# Patient Record
Sex: Female | Born: 1955 | Race: White | Hispanic: No | Marital: Married | Smoking: Never smoker
Health system: Southern US, Community
[De-identification: ages and names within clinical notes are randomized; demographics above are authoritative.]

## PROBLEM LIST (undated history)

## (undated) DIAGNOSIS — J329 Chronic sinusitis, unspecified: Secondary | ICD-10-CM

## (undated) DIAGNOSIS — I10 Essential (primary) hypertension: Secondary | ICD-10-CM

## (undated) DIAGNOSIS — Z5189 Encounter for other specified aftercare: Secondary | ICD-10-CM

## (undated) DIAGNOSIS — M1990 Unspecified osteoarthritis, unspecified site: Secondary | ICD-10-CM

## (undated) DIAGNOSIS — J302 Other seasonal allergic rhinitis: Secondary | ICD-10-CM

## (undated) DIAGNOSIS — K219 Gastro-esophageal reflux disease without esophagitis: Secondary | ICD-10-CM

## (undated) HISTORY — PX: KNEE ARTHROSCOPY: SUR90

## (undated) HISTORY — PX: CHOLECYSTECTOMY: SHX55

## (undated) HISTORY — PX: COLONOSCOPY: SHX174

## (undated) HISTORY — DX: Gastro-esophageal reflux disease without esophagitis: K21.9

## (undated) HISTORY — DX: Encounter for other specified aftercare: Z51.89

## (undated) HISTORY — PX: AUGMENTATION MAMMAPLASTY: SUR837

---
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
## 1997-08-20 ENCOUNTER — Encounter: Admission: RE | Admit: 1997-08-20 | Discharge: 1997-08-20 | Payer: Self-pay | Admitting: Family Medicine

## 1997-10-30 ENCOUNTER — Encounter: Admission: RE | Admit: 1997-10-30 | Discharge: 1997-10-30 | Payer: Self-pay | Admitting: Family Medicine

## 1998-04-09 ENCOUNTER — Encounter: Admission: RE | Admit: 1998-04-09 | Discharge: 1998-04-09 | Payer: Self-pay | Admitting: Sports Medicine

## 1999-06-14 ENCOUNTER — Encounter: Admission: RE | Admit: 1999-06-14 | Discharge: 1999-06-14 | Payer: Self-pay | Admitting: Family Medicine

## 1999-06-21 ENCOUNTER — Encounter: Admission: RE | Admit: 1999-06-21 | Discharge: 1999-06-21 | Payer: Self-pay | Admitting: Family Medicine

## 1999-06-22 ENCOUNTER — Encounter: Admission: RE | Admit: 1999-06-22 | Discharge: 1999-07-26 | Payer: Self-pay | Admitting: Sports Medicine

## 1999-07-09 ENCOUNTER — Encounter: Admission: RE | Admit: 1999-07-09 | Discharge: 1999-07-09 | Payer: Self-pay | Admitting: Family Medicine

## 1999-07-26 ENCOUNTER — Encounter: Admission: RE | Admit: 1999-07-26 | Discharge: 1999-07-26 | Payer: Self-pay | Admitting: Sports Medicine

## 2000-08-15 ENCOUNTER — Encounter: Admission: RE | Admit: 2000-08-15 | Discharge: 2000-08-15 | Payer: Self-pay | Admitting: Family Medicine

## 2000-08-31 ENCOUNTER — Encounter: Admission: RE | Admit: 2000-08-31 | Discharge: 2000-08-31 | Payer: Self-pay | Admitting: Family Medicine

## 2000-09-05 ENCOUNTER — Encounter: Payer: Self-pay | Admitting: Sports Medicine

## 2000-09-05 ENCOUNTER — Encounter: Admission: RE | Admit: 2000-09-05 | Discharge: 2000-09-05 | Payer: Self-pay | Admitting: Sports Medicine

## 2000-10-04 ENCOUNTER — Encounter (INDEPENDENT_AMBULATORY_CARE_PROVIDER_SITE_OTHER): Payer: Self-pay | Admitting: Specialist

## 2000-10-04 ENCOUNTER — Observation Stay (HOSPITAL_COMMUNITY): Admission: RE | Admit: 2000-10-04 | Discharge: 2000-10-05 | Payer: Self-pay | Admitting: General Surgery

## 2000-10-04 ENCOUNTER — Encounter: Payer: Self-pay | Admitting: General Surgery

## 2001-11-15 ENCOUNTER — Encounter: Admission: RE | Admit: 2001-11-15 | Discharge: 2001-11-15 | Payer: Self-pay | Admitting: Family Medicine

## 2001-12-20 ENCOUNTER — Encounter: Admission: RE | Admit: 2001-12-20 | Discharge: 2001-12-20 | Payer: Self-pay | Admitting: Sports Medicine

## 2002-01-16 ENCOUNTER — Encounter: Admission: RE | Admit: 2002-01-16 | Discharge: 2002-01-16 | Payer: Self-pay | Admitting: Family Medicine

## 2002-01-24 ENCOUNTER — Encounter: Admission: RE | Admit: 2002-01-24 | Discharge: 2002-01-24 | Payer: Self-pay | Admitting: Sports Medicine

## 2002-02-28 ENCOUNTER — Encounter: Admission: RE | Admit: 2002-02-28 | Discharge: 2002-02-28 | Payer: Self-pay | Admitting: Sports Medicine

## 2003-01-30 ENCOUNTER — Encounter: Admission: RE | Admit: 2003-01-30 | Discharge: 2003-01-30 | Payer: Self-pay | Admitting: Family Medicine

## 2004-05-27 ENCOUNTER — Ambulatory Visit: Payer: Self-pay | Admitting: Sports Medicine

## 2004-05-27 ENCOUNTER — Ambulatory Visit (HOSPITAL_COMMUNITY): Admission: RE | Admit: 2004-05-27 | Discharge: 2004-05-27 | Payer: Self-pay | Admitting: Sports Medicine

## 2004-06-25 ENCOUNTER — Ambulatory Visit (HOSPITAL_COMMUNITY): Admission: RE | Admit: 2004-06-25 | Discharge: 2004-06-25 | Payer: Self-pay | Admitting: Sports Medicine

## 2004-06-25 ENCOUNTER — Ambulatory Visit: Payer: Self-pay | Admitting: Sports Medicine

## 2004-08-12 ENCOUNTER — Ambulatory Visit: Payer: Self-pay | Admitting: Sports Medicine

## 2004-09-16 ENCOUNTER — Ambulatory Visit: Payer: Self-pay | Admitting: Family Medicine

## 2004-10-08 ENCOUNTER — Ambulatory Visit: Payer: Self-pay | Admitting: Psychology

## 2004-10-14 ENCOUNTER — Ambulatory Visit: Payer: Self-pay | Admitting: Sports Medicine

## 2004-10-14 ENCOUNTER — Encounter: Payer: Self-pay | Admitting: Sports Medicine

## 2004-10-28 ENCOUNTER — Ambulatory Visit: Payer: Self-pay | Admitting: Psychology

## 2004-11-09 ENCOUNTER — Ambulatory Visit: Payer: Self-pay | Admitting: Family Medicine

## 2005-04-12 ENCOUNTER — Ambulatory Visit: Payer: Self-pay | Admitting: Family Medicine

## 2005-04-28 ENCOUNTER — Ambulatory Visit: Payer: Self-pay | Admitting: Family Medicine

## 2005-05-12 ENCOUNTER — Ambulatory Visit: Payer: Self-pay | Admitting: Family Medicine

## 2005-05-26 ENCOUNTER — Ambulatory Visit: Payer: Self-pay | Admitting: Psychology

## 2005-06-07 ENCOUNTER — Ambulatory Visit: Payer: Self-pay | Admitting: Sports Medicine

## 2005-06-21 ENCOUNTER — Ambulatory Visit: Payer: Self-pay | Admitting: Family Medicine

## 2005-07-14 ENCOUNTER — Ambulatory Visit: Payer: Self-pay | Admitting: Psychology

## 2005-08-01 ENCOUNTER — Ambulatory Visit: Payer: Self-pay | Admitting: Psychology

## 2005-08-15 ENCOUNTER — Ambulatory Visit: Payer: Self-pay | Admitting: Psychology

## 2005-09-08 ENCOUNTER — Ambulatory Visit: Payer: Self-pay | Admitting: Psychology

## 2005-09-29 ENCOUNTER — Ambulatory Visit: Payer: Self-pay | Admitting: Family Medicine

## 2005-10-13 ENCOUNTER — Ambulatory Visit: Payer: Self-pay | Admitting: Psychology

## 2005-10-27 ENCOUNTER — Ambulatory Visit: Payer: Self-pay | Admitting: Sports Medicine

## 2005-11-10 ENCOUNTER — Ambulatory Visit: Payer: Self-pay | Admitting: Psychology

## 2005-12-15 ENCOUNTER — Ambulatory Visit: Payer: Self-pay | Admitting: Psychology

## 2005-12-22 ENCOUNTER — Ambulatory Visit: Payer: Self-pay | Admitting: Sports Medicine

## 2006-01-19 ENCOUNTER — Ambulatory Visit: Payer: Self-pay | Admitting: Family Medicine

## 2006-02-23 ENCOUNTER — Ambulatory Visit: Payer: Self-pay | Admitting: Sports Medicine

## 2006-04-20 DIAGNOSIS — J45909 Unspecified asthma, uncomplicated: Secondary | ICD-10-CM

## 2006-04-20 DIAGNOSIS — J309 Allergic rhinitis, unspecified: Secondary | ICD-10-CM

## 2006-09-25 ENCOUNTER — Telehealth: Payer: Self-pay | Admitting: *Deleted

## 2006-09-26 ENCOUNTER — Ambulatory Visit: Payer: Self-pay | Admitting: Family Medicine

## 2007-05-10 ENCOUNTER — Ambulatory Visit: Payer: Self-pay | Admitting: Sports Medicine

## 2007-05-24 ENCOUNTER — Telehealth: Payer: Self-pay | Admitting: *Deleted

## 2007-05-24 ENCOUNTER — Telehealth: Payer: Self-pay | Admitting: Sports Medicine

## 2007-05-24 ENCOUNTER — Encounter: Payer: Self-pay | Admitting: *Deleted

## 2007-07-23 ENCOUNTER — Telehealth: Payer: Self-pay | Admitting: Psychology

## 2007-07-24 ENCOUNTER — Encounter: Payer: Self-pay | Admitting: Sports Medicine

## 2007-07-26 ENCOUNTER — Ambulatory Visit: Payer: Self-pay | Admitting: Sports Medicine

## 2007-07-26 ENCOUNTER — Ambulatory Visit: Payer: Self-pay | Admitting: Psychology

## 2007-07-26 DIAGNOSIS — F4323 Adjustment disorder with mixed anxiety and depressed mood: Secondary | ICD-10-CM

## 2007-07-27 ENCOUNTER — Ambulatory Visit: Payer: Self-pay | Admitting: Family Medicine

## 2007-07-27 ENCOUNTER — Encounter: Payer: Self-pay | Admitting: Sports Medicine

## 2007-07-30 LAB — CONVERTED CEMR LAB
Albumin: 3.6 g/dL (ref 3.5–5.2)
BUN: 20 mg/dL (ref 6–23)
CO2: 27 meq/L (ref 19–32)
Calcium: 9 mg/dL (ref 8.4–10.5)
Chloride: 107 meq/L (ref 96–112)
Cholesterol: 194 mg/dL (ref 0–200)
Glucose, Bld: 91 mg/dL (ref 70–99)
HDL: 58 mg/dL (ref >39)
Potassium: 4.6 meq/L (ref 3.5–5.3)
Triglycerides: 60 mg/dL (ref <150)

## 2007-08-09 ENCOUNTER — Ambulatory Visit: Payer: Self-pay | Admitting: Psychology

## 2007-08-09 ENCOUNTER — Ambulatory Visit: Payer: Self-pay | Admitting: Sports Medicine

## 2007-08-09 ENCOUNTER — Encounter: Payer: Self-pay | Admitting: Sports Medicine

## 2007-08-22 ENCOUNTER — Telehealth: Payer: Self-pay | Admitting: *Deleted

## 2007-08-22 ENCOUNTER — Ambulatory Visit: Payer: Self-pay | Admitting: Family Medicine

## 2007-08-22 ENCOUNTER — Encounter: Payer: Self-pay | Admitting: Family Medicine

## 2007-08-22 DIAGNOSIS — F449 Dissociative and conversion disorder, unspecified: Secondary | ICD-10-CM

## 2007-08-29 ENCOUNTER — Telehealth: Payer: Self-pay | Admitting: Family Medicine

## 2007-08-30 ENCOUNTER — Ambulatory Visit: Payer: Self-pay | Admitting: Psychology

## 2007-10-09 ENCOUNTER — Ambulatory Visit: Payer: Self-pay | Admitting: Psychology

## 2007-10-18 ENCOUNTER — Ambulatory Visit: Payer: Self-pay | Admitting: Sports Medicine

## 2007-10-24 ENCOUNTER — Encounter: Payer: Self-pay | Admitting: Sports Medicine

## 2007-10-25 ENCOUNTER — Ambulatory Visit: Payer: Self-pay | Admitting: Sports Medicine

## 2007-10-25 ENCOUNTER — Ambulatory Visit: Payer: Self-pay | Admitting: Family Medicine

## 2007-11-22 ENCOUNTER — Ambulatory Visit: Payer: Self-pay | Admitting: Family Medicine

## 2008-01-28 ENCOUNTER — Telehealth: Payer: Self-pay | Admitting: *Deleted

## 2008-01-30 ENCOUNTER — Ambulatory Visit: Payer: Self-pay | Admitting: Family Medicine

## 2008-01-30 ENCOUNTER — Emergency Department (HOSPITAL_COMMUNITY): Admission: EM | Admit: 2008-01-30 | Discharge: 2008-01-30 | Payer: Self-pay | Admitting: Emergency Medicine

## 2008-01-30 ENCOUNTER — Encounter: Payer: Self-pay | Admitting: Family Medicine

## 2008-02-07 ENCOUNTER — Ambulatory Visit: Payer: Self-pay | Admitting: Family Medicine

## 2008-04-24 ENCOUNTER — Ambulatory Visit: Payer: Self-pay | Admitting: Family Medicine

## 2008-05-08 ENCOUNTER — Encounter: Payer: Self-pay | Admitting: Family Medicine

## 2008-05-08 ENCOUNTER — Encounter: Admission: RE | Admit: 2008-05-08 | Discharge: 2008-05-08 | Payer: Self-pay | Admitting: Family Medicine

## 2008-07-17 ENCOUNTER — Ambulatory Visit: Payer: Self-pay | Admitting: Family Medicine

## 2008-08-14 ENCOUNTER — Encounter: Admission: RE | Admit: 2008-08-14 | Discharge: 2008-08-14 | Payer: Self-pay | Admitting: Family Medicine

## 2008-08-14 ENCOUNTER — Telehealth: Payer: Self-pay | Admitting: Family Medicine

## 2008-08-15 ENCOUNTER — Ambulatory Visit: Payer: Self-pay | Admitting: Sports Medicine

## 2008-08-29 ENCOUNTER — Ambulatory Visit: Payer: Self-pay | Admitting: Sports Medicine

## 2008-10-29 ENCOUNTER — Encounter (INDEPENDENT_AMBULATORY_CARE_PROVIDER_SITE_OTHER): Payer: Self-pay | Admitting: *Deleted

## 2008-10-29 ENCOUNTER — Ambulatory Visit: Payer: Self-pay | Admitting: Sports Medicine

## 2008-10-31 ENCOUNTER — Encounter: Payer: Self-pay | Admitting: Family Medicine

## 2008-11-06 ENCOUNTER — Encounter: Admission: RE | Admit: 2008-11-06 | Discharge: 2008-12-10 | Payer: Self-pay | Admitting: Sports Medicine

## 2008-11-06 ENCOUNTER — Encounter: Payer: Self-pay | Admitting: Sports Medicine

## 2008-11-19 ENCOUNTER — Encounter: Payer: Self-pay | Admitting: Family Medicine

## 2008-11-25 ENCOUNTER — Encounter: Payer: Self-pay | Admitting: Family Medicine

## 2008-11-25 ENCOUNTER — Ambulatory Visit: Payer: Self-pay | Admitting: Family Medicine

## 2008-12-09 ENCOUNTER — Ambulatory Visit: Payer: Self-pay | Admitting: Sports Medicine

## 2008-12-10 ENCOUNTER — Encounter: Admission: RE | Admit: 2008-12-10 | Discharge: 2008-12-10 | Payer: Self-pay | Admitting: Sports Medicine

## 2008-12-10 ENCOUNTER — Encounter: Payer: Self-pay | Admitting: Sports Medicine

## 2008-12-15 ENCOUNTER — Encounter (INDEPENDENT_AMBULATORY_CARE_PROVIDER_SITE_OTHER): Payer: Self-pay | Admitting: *Deleted

## 2008-12-22 ENCOUNTER — Encounter: Payer: Self-pay | Admitting: Family Medicine

## 2009-06-17 ENCOUNTER — Ambulatory Visit (HOSPITAL_COMMUNITY): Admission: RE | Admit: 2009-06-17 | Discharge: 2009-06-17 | Payer: Self-pay | Admitting: Family Medicine

## 2009-06-17 ENCOUNTER — Encounter (INDEPENDENT_AMBULATORY_CARE_PROVIDER_SITE_OTHER): Payer: Self-pay | Admitting: *Deleted

## 2009-06-17 ENCOUNTER — Encounter: Payer: Self-pay | Admitting: Cardiology

## 2009-06-17 ENCOUNTER — Ambulatory Visit: Payer: Self-pay | Admitting: Family Medicine

## 2009-06-17 DIAGNOSIS — R079 Chest pain, unspecified: Secondary | ICD-10-CM

## 2009-06-17 DIAGNOSIS — H538 Other visual disturbances: Secondary | ICD-10-CM | POA: Insufficient documentation

## 2009-06-17 LAB — CONVERTED CEMR LAB
AST: 19 units/L (ref 0–37)
Alkaline Phosphatase: 36 units/L — ABNORMAL LOW (ref 39–117)
BUN: 19 mg/dL (ref 6–23)
Calcium: 8.5 mg/dL (ref 8.4–10.5)
Creatinine, Ser: 0.69 mg/dL (ref 0.40–1.20)
Glucose, Bld: 93 mg/dL (ref 70–99)
HCT: 45.4 % (ref 36.0–46.0)
HDL: 55 mg/dL (ref >39)
Hemoglobin: 14.8 g/dL (ref 12.0–15.0)
LDL Cholesterol: 125 mg/dL — ABNORMAL HIGH (ref 0–99)
MCHC: 32.6 g/dL (ref 30.0–36.0)
RDW: 13.7 % (ref 11.5–15.5)
TSH: 2.041 microintl units/mL (ref 0.350–4.500)
Total CHOL/HDL Ratio: 3.5
Triglycerides: 62 mg/dL (ref <150)

## 2009-06-19 ENCOUNTER — Encounter: Payer: Self-pay | Admitting: Family Medicine

## 2009-06-23 ENCOUNTER — Encounter: Payer: Self-pay | Admitting: Family Medicine

## 2009-06-25 ENCOUNTER — Ambulatory Visit: Payer: Self-pay | Admitting: Surgery

## 2009-06-25 ENCOUNTER — Encounter: Payer: Self-pay | Admitting: Family Medicine

## 2009-06-25 ENCOUNTER — Ambulatory Visit (HOSPITAL_COMMUNITY): Admission: RE | Admit: 2009-06-25 | Discharge: 2009-06-25 | Payer: Self-pay | Admitting: Family Medicine

## 2009-06-29 ENCOUNTER — Encounter: Payer: Self-pay | Admitting: Family Medicine

## 2009-06-30 ENCOUNTER — Ambulatory Visit: Payer: Self-pay | Admitting: Cardiology

## 2009-07-14 ENCOUNTER — Ambulatory Visit (HOSPITAL_COMMUNITY): Admission: RE | Admit: 2009-07-14 | Discharge: 2009-07-14 | Payer: Self-pay | Admitting: Cardiology

## 2009-07-14 ENCOUNTER — Encounter: Payer: Self-pay | Admitting: Cardiology

## 2009-07-14 ENCOUNTER — Ambulatory Visit: Payer: Self-pay

## 2009-07-14 ENCOUNTER — Ambulatory Visit: Payer: Self-pay | Admitting: Cardiology

## 2009-07-29 ENCOUNTER — Ambulatory Visit: Payer: Self-pay | Admitting: Family Medicine

## 2009-07-29 ENCOUNTER — Encounter: Admission: RE | Admit: 2009-07-29 | Discharge: 2009-07-29 | Payer: Self-pay | Admitting: Family Medicine

## 2009-07-29 DIAGNOSIS — R609 Edema, unspecified: Secondary | ICD-10-CM | POA: Insufficient documentation

## 2009-08-12 ENCOUNTER — Encounter (INDEPENDENT_AMBULATORY_CARE_PROVIDER_SITE_OTHER): Payer: Self-pay | Admitting: *Deleted

## 2009-09-23 ENCOUNTER — Ambulatory Visit: Payer: Self-pay | Admitting: Family Medicine

## 2009-09-23 DIAGNOSIS — R0989 Other specified symptoms and signs involving the circulatory and respiratory systems: Secondary | ICD-10-CM

## 2009-09-23 DIAGNOSIS — R0609 Other forms of dyspnea: Secondary | ICD-10-CM | POA: Insufficient documentation

## 2009-09-23 DIAGNOSIS — M25569 Pain in unspecified knee: Secondary | ICD-10-CM

## 2009-09-23 LAB — CONVERTED CEMR LAB
BUN: 21 mg/dL (ref 6–23)
CO2: 26 meq/L (ref 19–32)
Calcium: 8.8 mg/dL (ref 8.4–10.5)
Creatinine, Ser: 0.69 mg/dL (ref 0.40–1.20)

## 2009-09-24 ENCOUNTER — Encounter: Payer: Self-pay | Admitting: Family Medicine

## 2009-09-27 ENCOUNTER — Encounter: Payer: Self-pay | Admitting: Family Medicine

## 2009-10-15 ENCOUNTER — Ambulatory Visit (HOSPITAL_BASED_OUTPATIENT_CLINIC_OR_DEPARTMENT_OTHER): Admission: RE | Admit: 2009-10-15 | Discharge: 2009-10-15 | Payer: Self-pay | Admitting: Family Medicine

## 2009-10-15 ENCOUNTER — Encounter: Payer: Self-pay | Admitting: Family Medicine

## 2009-10-17 ENCOUNTER — Ambulatory Visit: Payer: Self-pay | Admitting: Internal Medicine

## 2009-11-30 ENCOUNTER — Encounter: Payer: Self-pay | Admitting: Family Medicine

## 2009-12-01 ENCOUNTER — Telehealth: Payer: Self-pay | Admitting: *Deleted

## 2009-12-10 ENCOUNTER — Encounter: Payer: Self-pay | Admitting: Family Medicine

## 2009-12-10 DIAGNOSIS — G4700 Insomnia, unspecified: Secondary | ICD-10-CM | POA: Insufficient documentation

## 2010-03-23 NOTE — Assessment & Plan Note (Signed)
Summary: f/up,tcb   Vital Signs:  Patient profile:   55 year old female Height:      64 inches Weight:      177 pounds BMI:     30.49 O2 Sat:      98 % Temp:     98.2 degrees F oral Pulse rate:   96 / minute BP sitting:   136 / 98  (left arm) Cuff size:   regular CC: f/u asthma Is Patient Diabetic? No Comments pt states she feels like "lump" in throat   Primary Care Provider:  Denny Levy MD  CC:  f/u asthma.  History of Present Illness: f/u chest pains---not having them nearly as often  2) f/u "something stuck in throat"--no better. She did realize after starting the hartburn med that she HAD been having daily heartburn--it stopped and she no longer has to use tums or maalox. But she still has a sense of a "lump" in her throat. Does not interfere with swallowing. has gotten no worse.  3) Asthma--doing OK with current  meds.  4) snoring--continues to be a problem--wonders if this is what is causing the odd sensation in her throat. Nightly loud snring--occasionally wakes herself up. Some daytime fatigue.  5) Insomnia---some nights difficulty getting to sleep--about twice a week. Sometimes it is pain from her knee (previous arthroscope--[facility]). Sometimes it is just worry and aggravarion that keeps her awake. Some daytime fatigue.  5) LE edema improved with the HCTZ. No problems with it  Habits & Providers  Alcohol-Tobacco-Diet     Tobacco Status: never  Current Medications (verified): 1)  Singulair 10 Mg  Tabs (Montelukast Sodium) .... Take One Tablet Daily 2)  Multivitamin 3)  Prempro 0.3-1.5 Mg Tabs (Conj Estrog-Medroxyprogest Ace) .... Take 1 By Mouth Daily 4)  Vicodin 5-500 Mg Tabs (Hydrocodone-Acetaminophen) .... One By Mouth Q6 As Needed Pain 5)  Zyrtec Allergy 10 Mg Tabs (Cetirizine Hcl) .... Once Daily 6)  Glucosamine-Chondroitin 500-400 Mg Caps (Glucosamine-Chondroitin) .... 2000 Twice Daily 7)  Calcium 600 Mg Tabs (Calcium) .... Take One Tablety Once  Daily 8)  Levsin/sl 0.125 Mg Subl (Hyoscyamine Sulfate) .... Generic If Available Sig 1 Tab Under Tongue As Needed Esophagus Spasm, May Repeat Every Ten Minutes For Max of Three Tabs A Day 9)  Omeprazole 40 Mg Cpdr (Omeprazole) .Marland Kitchen.. 1 By Mouth Qd 10)  Advair Diskus 250-50 Mcg/dose Aepb (Fluticasone-Salmeterol) .Marland Kitchen.. 1 Puff Bid 11)  Hydrochlorothiazide 25 Mg Tabs (Hydrochlorothiazide) .... 1/2 Tab By Mouth Qd 12)  Ambien 10 Mg Tabs (Zolpidem Tartrate) .Marland Kitchen.. 1 By Mouth At Bedtime As Needed For Sleep  Allergies (verified): No Known Drug Allergies  Review of Systems       Please see HPI for additional ROS.   Physical Exam  General:  alert and well-developed.   Neck:  supple, full ROM, no masses, no thyromegaly, no thyroid nodules or tenderness, no JVD, and no carotid bruits.   Lungs:  normal respiratory effort, normal breath sounds, and no wheezes.   Heart:  normal rate, regular rhythm, and no murmur.   Abdomen:  soft.   Neurologic:  alert & oriented X3.   Psych:  memory intact for recent and remote, normally interactive, good eye contact, not anxious appearing, and not depressed appearing.     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 1/2 tab by mouth qd  Orders: Basic Met-[facility] ([address]-56213) better on HCTZ check potassium  Problem # 4:  KNEE PAIN, RIGHT, CHRONIC (ICD-719.46)  Her updated medication list for this problem includes:    Vicodin 5-500 Mg Tabs (Hydrocodone-acetaminophen) ..... One by mouth q6 as needed pain chronic knee pain since her 'scope. She will make appt w  her orthopedist--rx for 1-2 vicodin as needed at night  Problem # 5:  ASTHMA, UNSPECIFIED (ICD-493.90)  Her updated medication list for this problem includes:    Singulair 10 Mg Tabs (Montelukast sodium) .Marland Kitchen... Take one tablet daily    Advair Diskus 250-50 Mcg/dose Aepb (Fluticasone-salmeterol) .Marland Kitchen... 1 puff bid  Orders: [facility]- Est  Level 4 ([address]) doing OK__ I am still not sure this is classic asthma  Complete Medication List: 1)  Singulair 10 Mg Tabs (Montelukast sodium) .... Take one tablet daily 2)  Multivitamin  3)  Prempro 0.3-1.5 Mg Tabs (Conj estrog-medroxyprogest ace) .... Take 1 by mouth daily 4)  Vicodin 5-500 Mg Tabs (Hydrocodone-acetaminophen) .... One by mouth q6 as needed pain 5)  Zyrtec Allergy 10 Mg Tabs (Cetirizine hcl) .... Once daily 6)  Glucosamine-chondroitin 500-400 Mg Caps (Glucosamine-chondroitin) .... 2000 twice daily 7)  Calcium 600 Mg Tabs (Calcium) .... Take one tablety once daily 8)  Levsin/sl 0.125 Mg Subl (Hyoscyamine sulfate) .... Generic if available sig 1 tab under tongue as needed esophagus spasm, may repeat every ten minutes for max of three tabs a day 9)  Omeprazole 40 Mg Cpdr (Omeprazole) .Marland Kitchen.. 1 by mouth qd 10)  Advair Diskus 250-50 Mcg/dose Aepb (Fluticasone-salmeterol) .Marland Kitchen.. 1 puff bid 11)  Hydrochlorothiazide 25 Mg Tabs (Hydrochlorothiazide) .... 1/2 tab by mouth qd 12)  Ambien 10 Mg Tabs (Zolpidem tartrate) .Marland Kitchen.. 1 by mouth at bedtime as needed for sleep  Patient Instructions: 1)  Please schedule a follow-up appointment in 2 months.  Prescriptions: AMBIEN 10 MG TABS (ZOLPIDEM TARTRATE) 1 by mouth at bedtime as needed for sleep  #30 x 1   Entered and Authorized by:   Denny Levy MD   Signed by:   Denny Levy MD on 09/23/2009   Method used:   Print then Give to Patient   RxID:   [id number] VICODIN 5-500 MG TABS (HYDROCODONE-ACETAMINOPHEN) one by mouth q6 as needed pain  #60 x 0   Entered and Authorized by:   Denny Levy MD   Signed by:   Denny Levy MD on 09/23/2009   Method used:   Print then Give to Patient   RxID:   [id number]

## 2010-03-23 NOTE — Progress Notes (Signed)
  Phone Note Outgoing Call   Call placed by: Jimmy Footman, CMA,  December 01, 2009 12:33 PM Summary of Call: LVM for patient to call back office to inform of rx being called into pharmacy. [facility] [address]  Follow-up for Phone Call        pt called and informed of rx Follow-up by: Loralee Pacas CMA,  December 02, 2009 10:02 AM

## 2010-03-23 NOTE — Letter (Signed)
Summary: Generic Letter  [facility]  [address]   [place], [place] [address]   Phone: [phone]  Fax: [phone]    09/24/2009  DAROLYN DOUBLE [address] [place], [place]  [address]  Dear Ms. Shellee Milo,  Sodium, glucose, potassium and kidney function all normal.         Sincerely,   Denny Levy MD  Appended Document: Generic Letter mailed
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
## 2010-03-23 NOTE — Assessment & Plan Note (Signed)
Summary: F/U/KH   Vital Signs:  Patient profile:   55 year old female Height:      64 inches Weight:      178 pounds BMI:     30.66 O2 Sat:      96 % on Room air Temp:     98.1 degrees F oral Pulse rate:   80 / minute BP sitting:   127 / 92  (right arm) Cuff size:   regular  Vitals Entered By: Tessie Fass CMA (July 29, 2009 9:49 AM)  O2 Flow:  Room air CC: F/U Pain Assessment Patient in pain? yes     Location: chest Intensity: 4    Primary Care Provider:  Denny Levy MD  CC:  F/U.  History of Present Illness: f/u chst pains--had cardiac myoview ane ECHo (negative). continues to have chect pains, esp at work  Also now had an episode of what was dx as esophagela spasm--intense pain in chedt while at [employer]--assoc with sweating, panic sensation and some SOB. lasted about 20 minutes. Slef resolved when she got in air conditioning--went to ED and was diagnosed with esophageal spasm. says there is a sensation she has something stuck in her throat all of the time.  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 Take One Tablet Daily 2)  Multivitamin 3)  Prempro 0.3-1.5 Mg Tabs (Conj Estrog-Medroxyprogest Ace) .... Take 1 By Mouth Daily 4)  Vicodin 5-500 Mg Tabs (Hydrocodone-Acetaminophen) .... One By Mouth Q6 As Needed Pain 5)  Zyrtec Allergy 10 Mg Tabs (Cetirizine Hcl) .... Once Daily 6)  Glucosamine-Chondroitin 500-400 Mg Caps (Glucosamine-Chondroitin) .... 2000 Twice Daily 7)  Calcium 600 Mg Tabs (Calcium) .... Take One Tablety Once Daily 8)  Levsin/sl 0.125 Mg Subl (Hyoscyamine Sulfate) .... Generic If Available Sig 1 Tab Under Tongue As Needed Esophagus Spasm, May Repeat Every Ten Minutes For Max of  Three Tabs A Day 9)  Omeprazole 40 Mg Cpdr (Omeprazole) .Marland Kitchen.. 1 By Mouth Qd 10)  Advair Diskus 250-50 Mcg/dose Aepb (Fluticasone-Salmeterol) .Marland Kitchen.. 1 Puff Bid 11)  Hydrochlorothiazide 25 Mg Tabs (Hydrochlorothiazide) .... 1/2 Tab By Mouth Qd  Allergies: No Known Drug Allergies  Review of Systems       The patient complains of chest pain, dyspnea on exertion, and peripheral edema.  The patient denies anorexia, fever, weight loss, vision loss, syncope, prolonged cough, headaches, abdominal pain, and muscle weakness.    Physical Exam  General:  alert, well-developed, and well-nourished.   Neck:  supple, full ROM, no masses, no thyromegaly, no JVD, and no carotid bruits.   Lungs:  normal respiratory effort, normal breath sounds, and no wheezes.   Heart:  normal rate, regular rhythm, and no murmur.   Abdomen:  soft, non-tender, and normal bowel sounds.   Extremities:  1+ edema B LE Neurologic:  alert & oriented X3 and gait normal.   Psych:  normally interactive and slightly anxious.     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 Take one tablet daily 2)  Multivitamin  3)  Prempro 0.3-1.5 Mg Tabs (Conj estrog-medroxyprogest ace) .... Take 1 by mouth daily 4)  Vicodin 5-500 Mg Tabs (Hydrocodone-acetaminophen)  .... One by mouth q6 as needed pain 5)  Zyrtec Allergy 10 Mg Tabs (Cetirizine hcl) .... Once daily 6)  Glucosamine-chondroitin 500-400 Mg Caps (Glucosamine-chondroitin) .... 2000 twice daily 7)  Calcium 600 Mg Tabs (Calcium) .... Take one tablety once daily 8)  Levsin/sl 0.125 Mg Subl (Hyoscyamine sulfate) .... Generic if available sig 1 tab under tongue as needed esophagus spasm, may repeat every ten minutes for max of three tabs a day 9)  Omeprazole 40 Mg Cpdr (Omeprazole) .Marland Kitchen.. 1 by mouth qd 10)  Advair Diskus 250-50 Mcg/dose Aepb (Fluticasone-salmeterol) .Marland Kitchen.. 1 puff bid 11)  Hydrochlorothiazide 25 Mg Tabs (Hydrochlorothiazide) .... 1/2 tab by mouth qd Pulse Oximetry- [facility] (94760) [facility]- Est  Level 4 ([address])  Patient Instructions: 1)  We are starting omeprazole for your esophageal spasm and what I think is silent reflux. Please take one a day. 2)  If you have another episode of spasm, try the levsin. 3)  For your edema and slight elevation of blood pressure, we are going to try a low dose diuretic called hydrochlorothiazide. Please take one a day 4)  Please see me in about  4 weeks. 5)  Please set her up for pharmacy clinic for pulmonary function tests Prescriptions: HYDROCHLOROTHIAZIDE 25 MG TABS (HYDROCHLOROTHIAZIDE) 1/2 tab by mouth qd  #15 x 1   Entered and Authorized by:   Denny Levy MD   Signed by:   Denny Levy MD on 07/29/2009   Method used:   Electronically to        [facility] [address] [id number]* (retail)       [address] [place], [place]  [address]       Ph: [phone]       Fax: [phone]   RxID:   [id number] LEVSIN/SL 0.125 MG SUBL (HYOSCYAMINE SULFATE) generic if available sig 1 tab under tongue as needed esophagus spasm, may repeat every ten minutes for max of three tabs a day  #30 x 3   Entered and Authorized by:   Denny Levy MD   Signed by:   Denny Levy MD on 07/29/2009   Method used:   Electronically to        [facility] [id number]* (retail)        [address] [place], [place]  [address]       Ph: [phone]       Fax: [phone]   RxID:   [id number] OMEPRAZOLE 40 MG CPDR (OMEPRAZOLE) 1 by mouth qd  #30 x 12   Entered and Authorized by:   Denny Levy MD   Signed by:   Denny Levy MD on 07/29/2009   Method used:   Electronically to        [facility] [address] [id number]* (retail)       [phone]       [place], [place]  [address]       Ph: [phone]       Fax: [phone]   RxID:   [id number] ADVAIR DISKUS 250-50 MCG/DOSE AEPB (FLUTICASONE-SALMETEROL) 1 puff bid  #3 x 3   Entered and Authorized by:   Denny Levy MD   Signed by:   Denny Levy MD on 07/29/2009   Method used:   Print then Give to Patient   RxID:   [id number]    Impression & Recommendations:  Orders: [facility]- Est  Level 4 (25427)   Orders: [facility]- Est  Level 4 (06237)   Her updated medication list for this problem includes:    Singulair 10 Mg Tabs (Montelukast sodium) .Marland Kitchen... Take one tablet daily    Advair Diskus 250-50 Mcg/dose Aepb (Fluticasone-salmeterol) .Marland Kitchen... 1 puff bid  Orders: Pulse Oximetry- [facility] (94760) [facility]- Est  Level 4 ([address]31)   Her updated medication list for this problem includes:    Hydrochlorothiazide 25 Mg Tabs (Hydrochlorothiazide) .Marland Kitchen... 1/2 tab by mouth qd   Complete Medication List: 1)  Singulair 10 Mg Tabs (Montelukast sodium) .... Take one tablet daily 2)  Multivitamin  3)  Prempro 0.3-1.5 Mg Tabs (Conj estrog-medroxyprogest ace) .... Take 1 by mouth daily 4)  Vicodin 5-500 Mg Tabs (Hydrocodone-acetaminophen) .... One by mouth q6 as needed pain 5)  Zyrtec Allergy 10 Mg Tabs (Cetirizine hcl) .... Once daily 6)  Glucosamine-chondroitin 500-400 Mg Caps (Glucosamine-chondroitin) .... 2000 twice daily 7)  Calcium 600 Mg Tabs (Calcium) .... Take one tablety once daily 8)  Levsin/sl 0.125 Mg Subl (Hyoscyamine sulfate) .... Generic if available sig 1 tab under tongue as needed esophagus spasm, may repeat every ten  minutes for max of three tabs a day 9)  Omeprazole 40 Mg Cpdr (Omeprazole) .Marland Kitchen.. 1 by mouth qd 10)  Advair Diskus 250-50 Mcg/dose Aepb (Fluticasone-salmeterol) .Marland Kitchen.. 1 puff bid 11)  Hydrochlorothiazide 25 Mg Tabs (Hydrochlorothiazide) .... 1/2 tab by mouth qd

## 2010-03-23 NOTE — Letter (Signed)
Summary: [facility]  [address]   [place], [place] [address]   Phone: [phone]  Fax: [phone]    06/19/2009  YARET HUSH [address] [place], [place]  [address]  Dear Ms. MYER, your cholesterol and lab work is satisfactory. I have listed the entire cholesteol panel below.  Cholesterol               192 mg/dL                   4-401     ATP III Classification:           < 200        mg/dL        Desirable          200 - 239     mg/dL        Borderline High          >= 240        mg/dL        High         Triglyceride              62 mg/dL                    <027   HDL Cholesterol           55 mg/dL                    >25   Total Chol/HDL Ratio      3.5 Ratio  VLDL Cholesterol (Calc)                             12 mg/dL                    3-66  LDL Cholesterol (Calc)                        [H]  125 mg/dL                   4-40             Sincerely,   Denny Levy MD  Appended Document: LABLetter mailed.

## 2010-03-23 NOTE — Miscellaneous (Signed)
  Clinical Lists Changes  Problems: Changed problem from ASTHMA, UNSPECIFIED (ICD-493.90) to ASTHMA, INTERMITTENT, MILD (ICD-493.90)

## 2010-03-23 NOTE — Letter (Signed)
Summary: LAB Letter  [facility]  [address]   [place], [place] [address]   Phone: [phone]  Fax: [phone]    06/29/2009  Paula Aguilar [address] [place], [place]  [address]  Dear Ms. Shellee Milo,   Your carotid doppler studies were normal.        Sincerely,   Denny Levy MD  Appended Document: LAB Letter mailed.

## 2010-03-23 NOTE — Letter (Signed)
Summary: Out of Work  [facility]  [address]   [place], [place] [address]   Phone: [phone]  Fax: [phone]    June 17, 2009   Employee:  Paula Aguilar    To Whom It May Concern:   For Medical reasons, please excuse the above named employee from work for the following dates:  Start:   June 17, 2009    End:  June 17, 2009  If you need additional information, please feel free to contact our office.         Sincerely,    Gladstone Pih

## 2010-03-23 NOTE — Miscellaneous (Signed)
Summary: prior auth  Clinical Lists Changes form to [facility] to get form for zolpidem.Golden Circle RN  December 10, 2009 3:06 PM filled out and faxed back  Problems: Added new problem of INSOMNIA (ICD-780.52)
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
## 2010-03-23 NOTE — Consult Note (Signed)
Summary: [facility] Ear Nose Throat  [facility]ose [facility]ro[facility]   Imported By: Clydell Hakim 10/09/2009 15:22:57  _____________________________________________________________________  External Attachment:    Type:   Image     Comment:   External Document  Appended Document: [facility]ro[facility]    Clinical Lists Changes  Medications: Changed medication from OMEPRAZOLE 40 MG CPDR (OMEPRAZOLE) 1 by mouth qd to OMEPRAZOLE 40 MG CPDR (OMEPRAZOLE) 1 by mouthbid

## 2010-03-23 NOTE — Letter (Signed)
Summary: Appointment - Missed  [facility], Main Office  [address]   [place], [place] [address]   Phone: [phone]  Fax: [phone]     August 12, 2009 MRN: [id number]   [name] [address] [place], [place]  [address]   Dear Ms. SCROGGS,  Our records indicate you missed your appointment on 08/07/09 with Dr.  Shirlee Latch. It is very important that we reach you to reschedule this appointment. We look forward to participating in your health care needs. Please contact us at the number listed above at your earliest convenience to reschedule this appointment.     Sincerely,   Migdalia Dk [facility] Scheduling Team

## 2010-03-23 NOTE — Letter (Signed)
Summary: [facility] Office Note  [facility] Note   Imported By: Roderic Ovens 07/01/2009 16:42:08  _____________________________________________________________________  External Attachment:    Type:   Image     Comment:   External Document

## 2010-07-09 NOTE — Op Note (Signed)
[facility]  Patient:    Paula Aguilar, Paula Aguilar                         MRN: [id number] Proc. Date: 10/04/00 Adm. Date:  [id number] Attending:  Carson Myrtle CC:         [facility]   Operative Report  PREOPERATIVE DIAGNOSIS:  Cholecystolithiasis.  POSTOPERATIVE DIAGNOSIS:  Cholecystolithiasis.  PROCEDURE:  Laparoscopic cholecystectomy and operative cholangiogram.  SURGEON:  Kendrick Ranch, M.D.  ASSISTANT:  Danna Hefty, M.D.  ANESTHESIA:  CRNA M.D. supervised.  INDICATIONS FOR PROCEDURE:  Ms. Paula Aguilar is 6, generally healthy. She has had a history of reflux and has been treated with H2 blockers and has developed in the past couple of months recurring post prandial right upper quadrant pain. Gallstones were documented, laboratory data are normal. She has elected to proceed with a cholecystectomy as carefully described.  DESCRIPTION OF PROCEDURE:  The patient was brought to the operating room, placed supine, general endotracheal anesthesia administered. The abdomen was scrubbed, prepped and draped in the usual fashion. Marcaine 0.25% with epinephrine was used prior to each skin incision. An infraumbilical vertical incision was made, the fascia identified, peritoneum entered without complications. The Hasson catheter placed, tied in place, the abdomen insufflated. Gentle peritoneoscopy was unrevealing and except the gallbladder seemed unusually adherent to the duodenum and colon. A second 10 mm trocar was placed in the mid epigastrium, two 5 mm trocars in the right upper quadrant. The dome of the gallbladder was grasped, placed on traction and the filmy adhesions of the omentum transverse colon were taken down bluntly without cautery as well. The adhesions of the omentum were taken down bluntly without cautery. The gallbladder was then freed. Its base was grasped, slight traction applied. Careful dissection revealed the base of the  gallbladder entering onto the normal appearing cystic duct. The cystic duct was carefully dissected out. It was short. It did enter the apparent common duct. A clip was placed at the junction of the gallbladder and cystic duct, a small incision made in the cystic duct. The Reddick catheter placed without difficulty into the cystic duct stump. Real-time cholangiography performed with fluoroscopy showing free rapid fill of the common bile duct into the duodenum as well the common hepatic and right and left hepatics filled. There were no apparent defects or abnormal findings. With this, the patient was repositioned. The Reddick catheter was removed and the stump of the cystic duct was doubly clipped and divided. The small posterior artery was doubly clipped. The main anterior cystic artery was dissected free, triply clipped and divided. The gallbladder was removed from the gallbladder bed with cautery without injury or complication. Cautery was used on the liver bed and careful inspection of all areas of dissection revealed no complications. There was no bile leak and no bleeding. The gallbladder was removed through the infraumbilical incision. That incision was then tied sharply with a #1 Vicryl. Careful inspection and irrigation was carried out including the infraumbilical incision. All irrigant, CO2, trocars and instruments removed. Each trocar site closed with interrupted subcuticular 3-0 monocryl. Steri-Strips applied, counts correct. The patient tolerated the procedure well and was awakened and taken to the recovery room in good condition. DD:  10/04/00 TD:  10/04/00 Job: [id number]

## 2010-07-28 ENCOUNTER — Encounter: Payer: Self-pay | Admitting: Family Medicine

## 2010-07-28 ENCOUNTER — Ambulatory Visit
Admission: RE | Admit: 2010-07-28 | Discharge: 2010-07-28 | Disposition: A | Discharge status: Home / Self Care | Payer: No Typology Code available for payment source | Source: Ambulatory Visit | Attending: Family Medicine | Admitting: Family Medicine

## 2010-07-28 ENCOUNTER — Ambulatory Visit (INDEPENDENT_AMBULATORY_CARE_PROVIDER_SITE_OTHER): Payer: No Typology Code available for payment source | Admitting: Family Medicine

## 2010-07-28 VITALS — BP 128/92 | HR 60 | Temp 98.1°F | Ht 64.0 in | Wt 160.0 lb

## 2010-07-28 DIAGNOSIS — F4323 Adjustment disorder with mixed anxiety and depressed mood: Secondary | ICD-10-CM

## 2010-07-28 DIAGNOSIS — M542 Cervicalgia: Secondary | ICD-10-CM

## 2010-07-28 DIAGNOSIS — M79603 Pain in arm, unspecified: Secondary | ICD-10-CM

## 2010-07-28 DIAGNOSIS — G4700 Insomnia, unspecified: Secondary | ICD-10-CM

## 2010-07-28 DIAGNOSIS — M79609 Pain in unspecified limb: Secondary | ICD-10-CM

## 2010-07-28 DIAGNOSIS — M25569 Pain in unspecified knee: Secondary | ICD-10-CM

## 2010-07-28 MED ORDER — HYDROCODONE-ACETAMINOPHEN 5-325 MG PO TABS: ORAL_TABLET | ORAL | Status: DC

## 2010-07-28 MED ORDER — TRAZODONE HCL 50 MG PO TABS: ORAL_TABLET | ORAL | Status: DC
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
## 2010-07-29 ENCOUNTER — Other Ambulatory Visit: Payer: Self-pay | Admitting: Family Medicine

## 2010-07-29 ENCOUNTER — Telehealth: Payer: Self-pay | Admitting: Family Medicine

## 2010-07-29 NOTE — Telephone Encounter (Signed)
Discussed neck films. Mild djd--likely the cause of her intermittent tingling as it is right level. Will follow.

## 2010-08-10 ENCOUNTER — Other Ambulatory Visit: Payer: Self-pay | Admitting: Family Medicine

## 2010-08-10 MED ORDER — CONJ ESTROG-MEDROXYPROGEST ACE 0.3-1.5 MG PO TABS: 1.0000 | ORAL_TABLET | Freq: Every day | ORAL | Status: DC

## 2010-11-26 LAB — POCT CARDIAC MARKERS
CKMB, poc: 1.5 ng/mL (ref 1.0–8.0)
Myoglobin, poc: 58.6 ng/mL (ref 12–200)
Troponin i, poc: <0.05 ng/mL (ref 0.00–0.09)

## 2010-11-26 LAB — DIFFERENTIAL
Basophils Absolute: 0 10*3/uL (ref 0.0–0.1)
Basophils Relative: 0 % (ref 0–1)
Lymphocytes Relative: 10 % — ABNORMAL LOW (ref 12–46)
Monocytes Absolute: 1 10*3/uL (ref 0.1–1.0)
Neutro Abs: 11.4 10*3/uL — ABNORMAL HIGH (ref 1.7–7.7)
Neutrophils Relative %: 82 % — ABNORMAL HIGH (ref 43–77)

## 2010-11-26 LAB — POCT I-STAT, CHEM 8
BUN: 21 mg/dL (ref 6–23)
Chloride: 106 mEq/L (ref 96–112)
HCT: 42 % (ref 36.0–46.0)
Potassium: 3.7 mEq/L (ref 3.5–5.1)
Sodium: 143 mEq/L (ref 135–145)

## 2010-11-26 LAB — CBC
Hemoglobin: 14.9 g/dL (ref 12.0–15.0)
RDW: 12.8 % (ref 11.5–15.5)

## 2011-03-23 ENCOUNTER — Ambulatory Visit (INDEPENDENT_AMBULATORY_CARE_PROVIDER_SITE_OTHER): Payer: No Typology Code available for payment source | Admitting: Family Medicine

## 2011-03-23 ENCOUNTER — Encounter: Payer: Self-pay | Admitting: Family Medicine

## 2011-03-23 VITALS — BP 123/85 | HR 78 | Temp 97.7°F | Ht 64.0 in | Wt 152.3 lb

## 2011-03-23 DIAGNOSIS — M7520 Bicipital tendinitis, unspecified shoulder: Secondary | ICD-10-CM

## 2011-03-23 DIAGNOSIS — J309 Allergic rhinitis, unspecified: Secondary | ICD-10-CM

## 2011-03-23 DIAGNOSIS — G4700 Insomnia, unspecified: Secondary | ICD-10-CM

## 2011-03-23 MED ORDER — FLUTICASONE-SALMETEROL 250-50 MCG/DOSE IN AEPB: 1.0000 | INHALATION_SPRAY | Freq: Two times a day (BID) | RESPIRATORY_TRACT | Status: DC

## 2011-03-23 NOTE — Progress Notes (Signed)
  Subjective:    Patient ID: Paula Aguilar, female    DOB: 04-08-1955, 56 y.o.   MRN: [id number]  HPI  #1. Right upper arm pain for the last 4-6 weeks. 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 This has helped with her knee pain but now she's having some arm pain. Denies numbness or tingling in her hand. He does continue to have bilateral thumb arthritic kind of pain. The upper arm pain is well localized worse with certain movements, aches at times. #2. Feels like her left ear is stopped up. No fever. No nasal discharge. Still has some problems with her allergies at times in the last week has been particularly bad. Using over-the-counter allergy medicine. #3. Has had problems in the past with intermittent asthma-like symptoms. He is a refill on her Advair. Doesn't use it all the time just when she is having issues. With the increase in her allergy symptoms she is afraid that maybe it is time to get that refilled. Not currently having any wheezing. #4. Chronic intermittent insomnia which is currently doing pretty well.  Review of Systems Denies fever, sweats, chills. No unusual weight gain or loss. He see history of present illness above for additional review of systems.    Objective:   Physical Exam  Vital signs reviewed. GENERAL: Well-developed, well-nourished, no acute distress. CARDIOVASCULAR: Regular rate and rhythm no murmur gallop or rub LUNGS: Clear to auscultation bilaterally, no rales or wheeze. HEENT: Bilateral TMs have good landmarks and are mobile. There is no sign of serous effusion. Ear canals are normal. There is a lot of wax. MSK: Bilateral shoulders have full range of motion. She has intact strength to help place of the rotator cuff on the right shoulder. Tenderness to palpation over the biceps tendon and resisted motion using biceps muscle reproduces her pain.  INJECTION: Patient was given informed consent,  signed copy in the chart. Appropriate time out was taken. Area prepped and draped in usual sterile fashion. One half cc of methylprednisolone 40 mg/ml plus  one cc of 1% lidocaine without epinephrine was injected into the right tendon sheath of the biceps tendon using a(n) anterior approach. The patient tolerated the procedure well. There were no complications. Post procedure instructions were given.         Assessment & Plan:  1. bicipital tendinitis on the right. Corticosteroid injection today. Start her on some bicep curls as a rehabilitation in 48 hours. #2. Allergic rhinitis and some history of asthma. Will refill her Advair #3. Insomnia currently seems stable.

## 2011-05-19 ENCOUNTER — Emergency Department (INDEPENDENT_AMBULATORY_CARE_PROVIDER_SITE_OTHER)
Admission: EM | Admit: 2011-05-19 | Discharge: 2011-05-19 | Disposition: A | Discharge status: Home / Self Care | Payer: No Typology Code available for payment source | Source: Home / Self Care | Attending: Emergency Medicine | Admitting: Emergency Medicine

## 2011-05-19 ENCOUNTER — Telehealth: Payer: Self-pay | Admitting: Family Medicine

## 2011-05-19 ENCOUNTER — Encounter (HOSPITAL_COMMUNITY): Payer: Self-pay | Admitting: Emergency Medicine

## 2011-05-19 DIAGNOSIS — K529 Noninfective gastroenteritis and colitis, unspecified: Secondary | ICD-10-CM

## 2011-05-19 DIAGNOSIS — J329 Chronic sinusitis, unspecified: Secondary | ICD-10-CM

## 2011-05-19 DIAGNOSIS — K5289 Other specified noninfective gastroenteritis and colitis: Secondary | ICD-10-CM

## 2011-05-19 HISTORY — DX: Other seasonal allergic rhinitis: J30.2

## 2011-05-19 HISTORY — DX: Chronic sinusitis, unspecified: J32.9

## 2011-05-19 HISTORY — DX: Unspecified osteoarthritis, unspecified site: M19.90

## 2011-05-19 HISTORY — DX: Essential (primary) hypertension: I10

## 2011-05-19 MED ORDER — ONDANSETRON 4 MG PO TBDP
ORAL_TABLET | ORAL | Status: AC
  Filled 2011-05-19: qty 1

## 2011-05-19 MED ORDER — PSEUDOEPHEDRINE-GUAIFENESIN ER 120-1200 MG PO TB12: 1.0000 | ORAL_TABLET | Freq: Two times a day (BID) | ORAL | Status: DC

## 2011-05-19 MED ORDER — ONDANSETRON 4 MG PO TBDP
4.0000 mg | ORAL_TABLET | Freq: Once | ORAL | Status: AC
  Administered 2011-05-19: 4 mg via ORAL

## 2011-05-19 MED ORDER — ONDANSETRON 8 MG PO TBDP: ORAL_TABLET | ORAL | Status: AC

## 2011-05-19 MED ORDER — DOXYCYCLINE HYCLATE 100 MG PO CAPS: 100.0000 mg | ORAL_CAPSULE | Freq: Two times a day (BID) | ORAL | Status: AC

## 2011-05-19 NOTE — ED Notes (Signed)
Here with sinus sx that started Wednesday am with sensitive to light,facial pressure then progressed this am n/v/d unable to keep food or fluids down.no fevers reported.

## 2011-05-19 NOTE — Discharge Instructions (Signed)
Take the medication as written. Return if you get worse, have a fever >100.4, or for any concerns. You may take 600 mg of motrin with 1 gram of tylenol up to 4 times a day as needed for pain. This is an effective combination for pain.  Most sinus infections are viral and do not need antibiotics unless you have a high fever, have had this for 10 day, or you get better and then get sick again. Use a neti pot or the NeilMed sinus rinse as often as you want to to reduce nasal congestion. Follow the directions on the box. Take the Zofran. It may cause constipation. Start taking small amounts electrolyte-containing fluids such as Pedialyte, Gatorade, or another source strength. Increase the amount and frequency of your fluid intake as you're able to keep it down. Return to the ER if you're unable to keep anything down despite the Zofran, if you worse, or for any other concerns.   Go to www.goodrx.com to look up your medications. This will give you a list of where you can find your prescriptions at the most affordable prices.

## 2011-05-19 NOTE — ED Notes (Signed)
PT ABLE TO KEEP GINGER ALE DOWN WITHOUT VOMITING OR NAUSEA

## 2011-05-19 NOTE — Telephone Encounter (Signed)
Returned call to patient.  Had nausea and dizziness 2 days ago.  Woke up next morning with dizziness, "pressure behind eyes and ears & teeth hurt."  Started having diarrhea this morning.  Today is her 3rd day out of work and needs doctor's note.  Informed patient that we do not have any appts available for today and she can be evaluated at urgent care today.  Can also get doctor's note at urgent care visit.  Patient verbalized understanding.  Gaylene Brooks, RN

## 2011-05-19 NOTE — ED Provider Notes (Signed)
History     CSN: [id number]  Arrival date & time 05/19/11  1333   First MD Initiated Contact with Patient 05/19/11 1452      Chief Complaint  Patient presents with  . Sinusitis  . GI Problem    (Consider location/radiation/quality/duration/timing/severity/associated sxs/prior treatment) HPI Comments: Patient reports nasal congestion, bilateral frontal sinus pain/pressure, worse with bending forward and with lying down starting 4 days ago.. Reports lightheadedness, dizziness with rapid change in head position. No vertigo, tinnitus. Reports mild photophobia. No neck stiffness, rash. No purulent nasal drainage, epistaxis, ear pain, ear fullness. No visual changes, other headache. No dental pain. Patient also reports nausea, several episodes of nbnb emesis and watery, nonbloody diarrhea starting yesterday. Has diffuse, crampy abdominal pain prior to vomiting or having a bowel movement, but this then resolves. States that she is able to keep small amounts of fluids down and was able to eat some crackers earlier today. Reports decreased urine output. no abdominal distention, urgency, frequency, dysuria, hematuria. 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  Patient is a 56 y.o. female presenting with sinusitis and diarrhea. The history is provided by the patient. No language interpreter was used.  Sinusitis  This is a recurrent problem. The current episode started more than 2 days ago. The problem has not changed since onset.There has been no fever. Associated symptoms include congestion and sinus pressure. Pertinent negatives include no chills, no ear pain, no sore throat and no cough. She has tried nothing for the symptoms. The treatment provided no relief.  Diarrhea The primary symptoms include abdominal pain, nausea, vomiting and diarrhea. Primary symptoms do not include fever, melena or dysuria. The illness began yesterday. The onset was sudden. The problem has not changed since  onset. The illness is also significant for anorexia. The illness does not include chills.    Past Medical History  Diagnosis Date  . Asthma   . Sinusitis   . Hypertension     not requiring medication since 09/2010  . Arthritis   . Seasonal allergies     Past Surgical History  Procedure Date  . Cholecystectomy   . Cesarean section   . Knee arthroscopy     right side    History reviewed. No pertinent family history.  History  Substance Use Topics  . Smoking status: Not on file  . Smokeless tobacco: Not on file  . Alcohol Use: No    OB History    Grav Para Term Preterm Abortions TAB SAB Ect Mult Living                  Review of Systems  Constitutional: Negative for fever and chills.  HENT: Positive for congestion and sinus pressure. Negative for ear pain and sore throat.   Respiratory: Negative for cough.   Gastrointestinal: Positive for nausea, vomiting, abdominal pain, diarrhea and anorexia. Negative for melena.  Genitourinary: Negative for dysuria.    Allergies  Review of patient's allergies indicates no known allergies.  Home Medications   Current Outpatient Rx  Name Route Sig Dispense Refill  . CALCIUM CARBONATE 600 MG PO TABS Oral Take 600 mg by mouth daily.      Marland Kitchen DOXYCYCLINE HYCLATE 100 MG PO CAPS Oral Take 1 capsule (100 mg total) by mouth 2 (two) times daily. X 7 days 14 capsule 0  . CONJ ESTROG-MEDROXYPROGEST ACE 0.3-1.5 MG PO TABS Oral Take 1 tablet by mouth daily. 90 tablet 3  . FLUTICASONE-SALMETEROL 250-50 MCG/DOSE  IN AEPB Inhalation Inhale 1 puff into the lungs 2 (two) times daily. 60 each 12  . GLUCOSAMINE-CHONDROITIN 500-400 MG PO TABS Oral Take by mouth. 2000 twice daily     . MOMETASONE FUROATE 50 MCG/ACT NA SUSP Nasal Place 2 sprays into the nose daily.    Marland Kitchen MONTELUKAST SODIUM 10 MG PO TABS Oral Take 10 mg by mouth daily.      Marland Kitchen ONE-DAILY MULTI VITAMINS PO TABS Oral Take 1 tablet by mouth daily.      Marland Kitchen OMEPRAZOLE 40 MG PO CPDR Oral Take 40  mg by mouth 2 (two) times daily.      Marland Kitchen ONDANSETRON 8 MG PO TBDP  1/2- 1 tablet q 8 hr prn nausea, vomiting 20 tablet 0  . PSEUDOEPHEDRINE-GUAIFENESIN ER [phone] MG PO TB12 Oral Take 1 tablet by mouth 2 (two) times daily. 20 each 0    BP 119/75  Pulse 75  Temp(Src) 97.8 F (36.6 C) (Oral)  Resp 16  SpO2 100% Filed Vitals:   05/19/11 1507 05/19/11 1652 05/19/11 1653 05/19/11 1654  BP: 129/81 135/87 119/80 119/75  Pulse: 62 58 68 75  Temp: 97.8 F (36.6 C)     TempSrc: Oral     Resp: 16     SpO2: 100%       Physical Exam  Nursing note and vitals reviewed. Constitutional: She is oriented to person, place, and time. She appears well-developed and well-nourished. No distress.  HENT:  Head: Normocephalic and atraumatic.  Right Ear: Tympanic membrane normal.  Left Ear: Tympanic membrane normal.  Nose: Right sinus exhibits maxillary sinus tenderness. Right sinus exhibits no frontal sinus tenderness. Left sinus exhibits maxillary sinus tenderness. Left sinus exhibits no frontal sinus tenderness.  Mouth/Throat: Uvula is midline, oropharynx is clear and moist and mucous membranes are normal.       Poor dentition. No dental caries, no gingival tenderness.  Eyes: Conjunctivae and EOM are normal. Pupils are equal, round, and reactive to light.  Fundoscopic exam:      The right eye shows no hemorrhage and no papilledema.       The left eye shows no hemorrhage and no papilledema.  Neck: Normal range of motion and full passive range of motion without pain. Neck supple. Carotid bruit is not present. No Brudzinski's sign and no Kernig's sign noted.  Cardiovascular: Normal rate, regular rhythm and normal heart sounds.   Pulmonary/Chest: Effort normal and breath sounds normal.  Abdominal: Soft. Bowel sounds are normal. She exhibits no distension.  Musculoskeletal: Normal range of motion.  Lymphadenopathy:    She has no cervical adenopathy.  Neurological: She is alert and oriented to person,  place, and time. She has normal strength. She displays no tremor. No cranial nerve deficit or sensory deficit. She displays a negative Romberg sign. Coordination and gait normal.       Finger->nose, heel-> shin WNL. Tandem gait steady. Dix-Hallpike negative  Skin: Skin is warm and dry.  Psychiatric: She has a normal mood and affect. Her behavior is normal. Judgment and thought content normal.    ED Course  Procedures (including critical care time)  Labs Reviewed - No data to display No results found.   1. Sinusitis   2. Gastroenteritis       MDM  H&P most consistent with sinusitis starting 4 days ago. Patient has sinus tenderness. States she always gets lightheaded when she has sinus infections. Patient is slightly orthostatic, however, mucous membranes moist, Refill less than 2 seconds.  Gave patient 4 milligrams of Zofran ODT. Patient was feeling better and tolerating by mouth prior to discharge. Advised her to increase fluid intake, and discuss symptoms/ signs when to return the ED. Patient agrees with plan     Luiz Blare, MD 05/19/11 2257

## 2011-05-19 NOTE — Telephone Encounter (Signed)
Nausea/vomiting/diarrhea for 2 days - not feeling good and wants to come in.

## 2011-08-03 ENCOUNTER — Ambulatory Visit (INDEPENDENT_AMBULATORY_CARE_PROVIDER_SITE_OTHER): Payer: No Typology Code available for payment source | Admitting: Family Medicine

## 2011-08-03 ENCOUNTER — Encounter: Payer: Self-pay | Admitting: Family Medicine

## 2011-08-03 VITALS — BP 120/84 | HR 78 | Temp 97.8°F | Ht 64.0 in | Wt 154.0 lb

## 2011-08-03 DIAGNOSIS — M25519 Pain in unspecified shoulder: Secondary | ICD-10-CM

## 2011-08-03 DIAGNOSIS — M25511 Pain in right shoulder: Secondary | ICD-10-CM

## 2011-08-03 NOTE — Patient Instructions (Addendum)
We will call you with information about getting your shoulder x-rays.    Please set her up him a sports medicine clinic, if possible this Friday, for ultrasound right shoulder. Please also set her up for complete physical in the next few months at her convenience. Thanks!

## 2011-08-03 NOTE — Progress Notes (Signed)
  Subjective:    Patient ID: Paula Aguilar, female    DOB: [birth year], 56 y.o.   MRN: [id number]  HPI Right shoulder pain for the last 4-6 weeks. 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 She thinks this may be related to her shoulder pain it bothers her at night so she can sleep well. Pain with any overhead motion and with forward motions and picking up heavy items. She also has some chronic neck pain but that seems essentially unchanged.   Review of Systems Denies numbness or tingling in her hands. Her right arm is not week.    Objective:   Physical Exam  Vital signs reviewed. GENERAL: Well developed, well nourished, no acute distress SHOULDER: Full range of motion in bilateral shoulders. Some positive impingement signs with pain on supraspinatus testing in the right shoulder.      Assessment & Plan:  Shoulder pain. I would like to get some x-rays to look at her outlet to further evaluate her chromium. We'll also set her up for minutes for spasm where we can do ultrasound and dynamic testing. I suspect she's got some rotator cuff issues going on.

## 2011-08-04 ENCOUNTER — Other Ambulatory Visit: Payer: Self-pay | Admitting: Family Medicine

## 2011-08-05 ENCOUNTER — Ambulatory Visit (INDEPENDENT_AMBULATORY_CARE_PROVIDER_SITE_OTHER): Payer: No Typology Code available for payment source | Admitting: Family Medicine

## 2011-08-05 VITALS — BP 133/85

## 2011-08-05 DIAGNOSIS — M25511 Pain in right shoulder: Secondary | ICD-10-CM | POA: Insufficient documentation

## 2011-08-05 DIAGNOSIS — M25519 Pain in unspecified shoulder: Secondary | ICD-10-CM

## 2011-08-05 MED ORDER — HYDROCODONE-ACETAMINOPHEN 5-325 MG PO TABS: ORAL_TABLET | ORAL | Status: DC

## 2011-08-05 NOTE — Assessment & Plan Note (Signed)
Ultrasound guided subacromial, and glenohumeral injection. She will do the home rehabilitation exercises with [employer]. We'll see her back in 4 weeks. If no better to consider MRI to further evaluate the rotator cuff.

## 2011-08-05 NOTE — Progress Notes (Signed)
Patient ID: Paula Aguilar, female   DOB: [birth year], 56 y.o.   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 She localizes the pain over the deltoid, and notes it's worse with any type of reaching, or abduction.  she denies any discrete trauma.  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 She has not yet had subacromial injection however she has had a biceps tendon sheath injection, or physical therapy.  She does work out regularly in [employer], and does do [facility], shoulder press, but no abductors.  Past medical history, surgical history, family history, social history, allergies, and medications reviewed from the medical record and no changes needed.  Review of Systems: No fevers, chills, night sweats, weight loss, chest pain, or shortness of breath.    Objective:  General:  Well Developed, well nourished, and in no acute distress. Neuro:  Alert and oriented x3, extra-ocular muscles intact. Skin: Warm and dry, no rashes noted. Respiratory:  Not using accessory muscles, speaking in full sentences. Musculoskeletal: Right Shoulder: Inspection reveals no abnormalities, atrophy or asymmetry. Palpation is normal with no tenderness over AC joint or bicipital groove. Range of motion full, it looks somewhat painful in the extremes of flexion and abduction. Positive Neer, Hawkins, and empty can signs. Speeds and Yergason's tests normal. No labral pathology noted with negative Obrien's, negative clunk and good stability. Normal scapular function observed.  Real-time Ultrasound Guided Injection of: right glenohumeral joint, and right subacromial bursa. We ultrasounded all structures in the shoulder, there was some fluid around the biceps tendon sheath, she also had what appeared to be a partial width partial thickness  articular sided tear of the supraspinatus. She also had some degenerative changes visible in the posterior glenoid labrum.there also appeared to be some degenerative changes in the acromioclavicular joint. Verbal informed consent obtained. Time-out conducted. Noted no overlying erythema, induration, or other signs of local infection. Skin prepped in a sterile fashion. Local anesthesia: Topical Ethyl chloride. With sterile technique and under real time ultrasound guidance: needle advanced into the humeral joint from a posterior approach, 1 cc Depo-Medrol 40, 4 cc lidocaine injected. Procedure repeated anteriorly into the subacromial bursa with 1 cc Depo-Medrol 40, 4 cc lidocaine. Completed without difficulty Pain immediately resolved suggesting accurate placement of the medication. Advised to call if fevers/chills, erythema, induration, drainage, or persistent bleeding. Images saved.  Assessment & Plan:

## 2011-08-23 ENCOUNTER — Encounter: Payer: Self-pay | Admitting: Family Medicine

## 2011-08-23 ENCOUNTER — Ambulatory Visit (INDEPENDENT_AMBULATORY_CARE_PROVIDER_SITE_OTHER): Payer: No Typology Code available for payment source | Admitting: Family Medicine

## 2011-08-23 VITALS — BP 130/85 | HR 60 | Temp 97.8°F | Ht 64.0 in | Wt 156.9 lb

## 2011-08-23 DIAGNOSIS — L600 Ingrowing nail: Secondary | ICD-10-CM

## 2011-08-23 NOTE — Progress Notes (Signed)
  Subjective:    Patient ID: Paula Aguilar, female    DOB: 1955/12/04, 56 y.o.   MRN: [id number]  HPI Right great toe pain: Patient reports pain in right great toe x2 weeks. Worse for the past 3-4 days-pain with wearing shoes. Has ingrown toenail on right great toe. Used clippers and tweezers to dig the nail out of the skin. Pain increased after this. Has not been taking anything for pain. No drainage. No pus. Mild redness at site of ingrown toenail. No swelling of the time. Has had a history of ingrown toenails in the past.  Smoking status reviewed.   Review of Systems    as per above. Objective:   Physical Exam  Constitutional: She appears well-developed and well-nourished.  Cardiovascular: Normal rate.   Pulmonary/Chest: Effort normal. No respiratory distress.  Skin:       Right great toe: Toenail of right great toe- has normal apperance except for nail missing in medial upper nail bed.  Nail cut into a curve on the medial upper toe nail.  + mild erythema.  No swelling of toe. No drainage.  Minimal tenderness with palpation of toe.   Psychiatric: She has a normal mood and affect.          Assessment & Plan:

## 2011-08-23 NOTE — Patient Instructions (Addendum)
Ibuprofen 800mg  2 x per day by mouth as needed.    Ingrown Toenail An ingrown toenail occurs when the sharp edge of your toenail grows into the skin. Causes of ingrown toenails include toenails clipped too far back or poorly fitting shoes. Activities involving sudden stops (basketball, tennis) causing "toe jamming" may lead to an ingrown nail. HOME CARE INSTRUCTIONS   Soak the whole foot in warm soapy water for 20 minutes, 3 times per day.   You may lift the edge of the nail away from the sore skin by wedging a small piece of cotton under the corner of the nail. Be careful not to dig (traumatize) and cause more injury to the area.   Wear shoes that fit well. While the ingrown nail is causing problems, sandals may be beneficial.   Trim your toenails regularly and carefully. Cut your toenails straight across, not in a curve. This will prevent injury to the skin at the corners of the toenail.   Keep your feet clean and dry.   Crutches may be helpful early in treatment if walking is painful.   Antibiotics, if prescribed, should be taken as directed.   Return for a wound check in 2 days or as directed.   Only take over-the-counter or prescription medicines for pain, discomfort, or fever as directed by your caregiver.  SEEK IMMEDIATE MEDICAL CARE IF:   You have a fever.   You have increasing pain, redness, swelling, or heat at the wound site.   Your toe is not better in 7 days.  If conservative treatment is not successful, surgical removal of a portion or all of the nail may be necessary. MAKE SURE YOU:   Understand these instructions.   Will watch your condition.   Will get help right away if you are not doing well or get worse.  Document Released: 02/05/2000 Document Revised: 01/27/2011 Document Reviewed: 01/30/2008 [facility] Patient Information 2012 [place], [place].

## 2011-08-23 NOTE — Assessment & Plan Note (Addendum)
No signs or symptoms of infection. Pain now worse after trauma to nailbed from patient. Conservative treatment at this time: epsom salt soaks 2 x per day, supportive shoes, ibuprofen for discomfort.  Handout given.  See patient instructions.  Pt to return if new or worsening of symptoms or if not improved with conservative treatment.

## 2011-09-05 ENCOUNTER — Ambulatory Visit: Payer: No Typology Code available for payment source | Admitting: Family Medicine

## 2011-09-21 ENCOUNTER — Other Ambulatory Visit (HOSPITAL_COMMUNITY)
Admission: RE | Admit: 2011-09-21 | Discharge: 2011-09-21 | Disposition: A | Discharge status: Home / Self Care | Payer: No Typology Code available for payment source | Source: Ambulatory Visit | Attending: Family Medicine | Admitting: Family Medicine

## 2011-09-21 ENCOUNTER — Encounter: Payer: Self-pay | Admitting: Family Medicine

## 2011-09-21 ENCOUNTER — Ambulatory Visit (INDEPENDENT_AMBULATORY_CARE_PROVIDER_SITE_OTHER): Payer: No Typology Code available for payment source | Admitting: Family Medicine

## 2011-09-21 VITALS — BP 116/82 | HR 76 | Temp 98.1°F | Wt 158.0 lb

## 2011-09-21 DIAGNOSIS — R609 Edema, unspecified: Secondary | ICD-10-CM

## 2011-09-21 DIAGNOSIS — M25511 Pain in right shoulder: Secondary | ICD-10-CM

## 2011-09-21 DIAGNOSIS — Z01419 Encounter for gynecological examination (general) (routine) without abnormal findings: Secondary | ICD-10-CM | POA: Insufficient documentation

## 2011-09-21 DIAGNOSIS — R42 Dizziness and giddiness: Secondary | ICD-10-CM

## 2011-09-21 DIAGNOSIS — Z780 Asymptomatic menopausal state: Secondary | ICD-10-CM

## 2011-09-21 DIAGNOSIS — Z13820 Encounter for screening for osteoporosis: Secondary | ICD-10-CM

## 2011-09-21 DIAGNOSIS — H538 Other visual disturbances: Secondary | ICD-10-CM

## 2011-09-21 DIAGNOSIS — Z124 Encounter for screening for malignant neoplasm of cervix: Secondary | ICD-10-CM

## 2011-09-21 DIAGNOSIS — M25519 Pain in unspecified shoulder: Secondary | ICD-10-CM

## 2011-09-21 DIAGNOSIS — F4323 Adjustment disorder with mixed anxiety and depressed mood: Secondary | ICD-10-CM

## 2011-09-21 DIAGNOSIS — Z0000 Encounter for general adult medical examination without abnormal findings: Secondary | ICD-10-CM | POA: Insufficient documentation

## 2011-09-21 DIAGNOSIS — Z978 Presence of other specified devices: Secondary | ICD-10-CM

## 2011-09-21 DIAGNOSIS — Z9882 Breast implant status: Secondary | ICD-10-CM

## 2011-09-21 LAB — CBC WITH DIFFERENTIAL/PLATELET
Basophils Absolute: 0 10*3/uL (ref 0.0–0.1)
Basophils Relative: 1 % (ref 0–1)
Eosinophils Absolute: 0.1 10*3/uL (ref 0.0–0.7)
Hemoglobin: 14.6 g/dL (ref 12.0–15.0)
MCHC: 34.8 g/dL (ref 30.0–36.0)
Neutro Abs: 4 10*3/uL (ref 1.7–7.7)
Neutrophils Relative %: 61 % (ref 43–77)
Platelets: 326 10*3/uL (ref 150–400)
RDW: 13.3 % (ref 11.5–15.5)

## 2011-09-21 LAB — COMPREHENSIVE METABOLIC PANEL
ALT: 8 U/L (ref 0–35)
Albumin: 3.1 g/dL — ABNORMAL LOW (ref 3.5–5.2)
CO2: 25 mEq/L (ref 19–32)
Calcium: 8.1 mg/dL — ABNORMAL LOW (ref 8.4–10.5)
Chloride: 107 mEq/L (ref 96–112)
Creat: 0.75 mg/dL (ref 0.50–1.10)
Potassium: 4 mEq/L (ref 3.5–5.3)
Total Protein: 4.8 g/dL — ABNORMAL LOW (ref 6.0–8.3)

## 2011-09-21 LAB — LDL CHOLESTEROL, DIRECT: Direct LDL: 136 mg/dL — ABNORMAL HIGH

## 2011-09-21 MED ORDER — FUROSEMIDE 20 MG PO TABS: ORAL_TABLET | ORAL | Status: DC

## 2011-09-21 MED ORDER — ACYCLOVIR 5 % EX OINT: TOPICAL_OINTMENT | CUTANEOUS | Status: DC

## 2011-09-21 NOTE — Patient Instructions (Addendum)
Please keep track of your lightheaded episodes. I would like to see you back in one month to follow these up.  I'm giving you a prescription for furosemide. I would like you to take it when necessary for your swollen ankles. No more often than once a day.  For the bumps on her chest I'm also giving you a prescription for some cream. Try that in the next one puffs up. We'll also follow this up when I see you back.  I am getting some lab work today this is a general check. I will send you a note about that as well as about your Pap smear. Please set up her mammogram. I will have my nurse of the DEXA scan and call you with an appointment.

## 2011-09-23 DIAGNOSIS — Z9882 Breast implant status: Secondary | ICD-10-CM | POA: Insufficient documentation

## 2011-09-23 NOTE — Assessment & Plan Note (Signed)
Seems to be doing very well w/o currentissues

## 2011-09-23 NOTE — Assessment & Plan Note (Signed)
BMP today and start prn lowdose lasix Elevate LE when  Needed F/u 6 bw

## 2011-09-23 NOTE — Progress Notes (Signed)
  Subjective:    Patient ID: Paula Aguilar, female    DOB: 1955-03-06, 56 y.o.   MRN: [id number]  HPI  CPE Doing well w only 2 new issue--- 1) intermittent outbreak of small itchy bumps (one at a time that  Expands to 1 cm). More prevalent in summer. Only on chest  2) had lasik surgery and has had intermittent dbl vision since. Lasts few minutes. Also some assoc dizziness.has f/u visit w  optho in 1-2 weeks  Still LE swelling if driving all day, esp trips to beach which is every other WE in summer  Review of Systems  Constitutional: Negative for fever, activity change, appetite change, fatigue and unexpected weight change.  HENT: Negative for trouble swallowing and neck pain.   Eyes: Positive for visual disturbance. Negative for pain.  Respiratory: Negative for chest tightness and shortness of breath.   Genitourinary: Negative for dysuria, pelvic pain and dyspareunia.  Musculoskeletal: Positive for myalgias and arthralgias. Negative for joint swelling.  Skin: Positive for rash.  Neurological: Positive for dizziness. Negative for weakness, numbness and headaches.  Hematological: Negative for adenopathy.  Psychiatric/Behavioral: Negative for dysphoric mood and agitation. The patient is not nervous/anxious.        Objective:   Physical Exam  Vital signs reviewed GENERALl: Well developed, well nourished, in no acute distress. HEENT: PERRLA, EOMI no nystgmus, eyes move symmetrically. Optic disk looks smooth and normal optic cup. Normal appearing eye vasculature. NECK: Supple, FROM, without lymphadenopathy.  THYROID: normal without nodularity CAROTID ARTERIES: without bruits LUNGS: clear to auscultation bilaterally. No wheezes or rales. HEART: Regular rate and rhythm, no murmurs ABDOMEN: soft with positive bowel sounds MSK: MOE x 4 SKIN no rash but one small area anterior chest of healing ecoriated lesion--central area 4 mm scabbed surrounded by mild inflammatory change, diffuse  solar changes and very tan. NEURO: no focal deficits. CN 2-12 intact. No visual field defects on gross exam. Barre normal. DTRs U/L normal BREAST:B implants. No worrisome masses but examli mited by prosthe sis B. GU externallly normal, no adnexal masses, normal uterus and cervix Pap obtained      Assessment & Plan:

## 2011-09-23 NOTE — Assessment & Plan Note (Signed)
Sig improvement after CSI last ov Doing HEP rotator cuff

## 2011-09-23 NOTE — Assessment & Plan Note (Signed)
She will set up mammo We will set up DEXXA Has had colonoscopy Discussed exercise, vit D and calcium Discussed excessive sunexposure

## 2011-09-26 NOTE — Addendum Note (Signed)
Addended by: Damita Lack on: 09/26/2011 03:20 PM   Modules accepted: Orders

## 2011-10-04 ENCOUNTER — Encounter: Payer: Self-pay | Admitting: Family Medicine

## 2011-10-05 ENCOUNTER — Other Ambulatory Visit: Payer: No Typology Code available for payment source

## 2011-10-11 ENCOUNTER — Encounter: Payer: Self-pay | Admitting: Family Medicine

## 2011-10-11 ENCOUNTER — Ambulatory Visit
Admission: RE | Admit: 2011-10-11 | Discharge: 2011-10-11 | Disposition: A | Discharge status: Home / Self Care | Payer: No Typology Code available for payment source | Source: Ambulatory Visit | Attending: Family Medicine | Admitting: Family Medicine

## 2011-10-11 DIAGNOSIS — Z780 Asymptomatic menopausal state: Secondary | ICD-10-CM

## 2011-10-24 ENCOUNTER — Other Ambulatory Visit: Payer: Self-pay | Admitting: Family Medicine

## 2011-10-26 ENCOUNTER — Ambulatory Visit: Payer: No Typology Code available for payment source | Admitting: Family Medicine

## 2011-11-20 ENCOUNTER — Other Ambulatory Visit: Payer: Self-pay | Admitting: Family Medicine

## 2012-01-13 ENCOUNTER — Encounter: Payer: Self-pay | Admitting: Family Medicine

## 2012-01-13 ENCOUNTER — Ambulatory Visit (INDEPENDENT_AMBULATORY_CARE_PROVIDER_SITE_OTHER): Payer: No Typology Code available for payment source | Admitting: Family Medicine

## 2012-01-13 VITALS — BP 136/78 | HR 81 | Ht 64.0 in | Wt 158.0 lb

## 2012-01-13 DIAGNOSIS — M25511 Pain in right shoulder: Secondary | ICD-10-CM

## 2012-01-13 DIAGNOSIS — F449 Dissociative and conversion disorder, unspecified: Secondary | ICD-10-CM

## 2012-01-13 DIAGNOSIS — M25519 Pain in unspecified shoulder: Secondary | ICD-10-CM

## 2012-01-13 MED ORDER — HYOSCYAMINE SULFATE 0.125 MG PO TBDP
ORAL_TABLET | ORAL | Status: DC
Start: 2012-01-13 — End: 2013-03-18

## 2012-01-13 NOTE — Progress Notes (Signed)
  Subjective:    Patient ID: Paula Aguilar, female    DOB: 1955-11-17, 56 y.o.   MRN: [id number]  HPI Right shoulder pain. Had totally resolved after injection in February. She had done her exercises fairly regularly for several months after that. She had been without symptoms until earlier this month. 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 Pain is aching, Center no shoulder, occasionally radiates into the upper deltoid. Keeps her from sleeping at night. If she has to push a box above her head it is very painful.   Review of Systems Pertinent review of systems: negative for fever or unusual weight change.     Objective:   Physical Exam  Vital signs reviewed. GENERAL: Well developed, well nourished, no acute distress SHOULDER: Right. Full range of motion. Pain with supraspinatus testing. Rotator cuff strength is intact in all planes. Distally she is neurovascularly intact. Positive impingement signs. INJECTION: Patient was given informed consent, signed copy in the chart. Appropriate time out was taken. Area prepped and draped in usual sterile fashion. One cc of methylprednisolone 40 mg/ml plus  4 cc of 1% lidocaine without epinephrine was injected into the Right shoulder using a(n) Posterior approach. The patient tolerated the procedure well. There were no complications. Post procedure instructions were given.       Assessment & Plan:

## 2012-01-13 NOTE — Assessment & Plan Note (Signed)
Had significant improvement with first corticosteroid injection in February 2013. Today we gave her the second corticosteroid injection. She will restart the rehabilitation program. Followup when necessary

## 2012-01-13 NOTE — Assessment & Plan Note (Signed)
Refilled her medicine. She seems to be doing very well with this.

## 2012-02-24 ENCOUNTER — Other Ambulatory Visit: Payer: Self-pay | Admitting: Family Medicine

## 2012-02-24 NOTE — Telephone Encounter (Signed)
Called Dr. Jennette Kettle to verify directions. Take 2 tabs at bedtime  as needed for pain . May take two tabs twice daily (up to 4 tabs per day) if needed.  Rx called to pharmacy.

## 2012-02-24 NOTE — Telephone Encounter (Signed)
Dear White Team Please call this in THANKS! Amyr Sluder  

## 2012-03-14 ENCOUNTER — Encounter: Payer: Self-pay | Admitting: Family Medicine

## 2012-03-14 ENCOUNTER — Ambulatory Visit (INDEPENDENT_AMBULATORY_CARE_PROVIDER_SITE_OTHER): Payer: No Typology Code available for payment source | Admitting: Family Medicine

## 2012-03-14 VITALS — BP 153/87 | HR 71 | Ht 64.0 in | Wt 170.0 lb

## 2012-03-14 DIAGNOSIS — H6980 Other specified disorders of Eustachian tube, unspecified ear: Secondary | ICD-10-CM

## 2012-03-14 DIAGNOSIS — M25519 Pain in unspecified shoulder: Secondary | ICD-10-CM

## 2012-03-14 DIAGNOSIS — M25511 Pain in right shoulder: Secondary | ICD-10-CM

## 2012-03-14 MED ORDER — DIAZEPAM 5 MG PO TABS: ORAL_TABLET | ORAL | Status: DC

## 2012-03-14 NOTE — Patient Instructions (Addendum)
Restart your nasonex for 4 weeks and see if this helps the ear pain. My nurse will call you regarding the MR arthrogram of your right shoulder. IF we need to do x rays first, I will call you

## 2012-03-16 ENCOUNTER — Other Ambulatory Visit: Payer: Self-pay | Admitting: Family Medicine

## 2012-03-16 ENCOUNTER — Telehealth: Payer: Self-pay | Admitting: Family Medicine

## 2012-03-16 DIAGNOSIS — M25511 Pain in right shoulder: Secondary | ICD-10-CM

## 2012-03-16 NOTE — Progress Notes (Signed)
  Subjective:    Patient ID: Paula Aguilar, female    DOB: 1955/06/05, 57 y.o.   MRN: [id number]  HPI Followup right shoulder pain. I have given her corticosteroid injection and some exercises last time. The injection seemed to help for about a day and then I wore off. Exercises Robaxin seem to make it somewhat worse. Pain is present most of the time now least 2/10. After she's been using her shoulder a lot it 7-8/10. No numbness in hand. Pain is aching to sharp shooting pains. He does not radiate to the hand or neck.  #2. Left ear pain. Intermittent. Sharp. In bother her for the last 4 weeks. No change in hearing, no tinnitus.  Review of Systems Denies numbness or tingling in the right hand.    Objective:   Physical Exam  Vital signs are reviewed GENERAL: Well-developed female no acute distress HEENT: Bilateral TMs are retracted. Left TM has some serous otitis present. SHOULDER: Right. Poor range of motion in all planes the rotator cuff. She does have some pain with axial loading and posterior stress concerning for labral lesion. Positive O'Briant.     Assessment & Plan:  #1. Shoulder pain. Did not respond to rotator cuff strengthening and subacromial corticosteroid injection. She has some fraying of the labrum. We'll get MRI for gram. She will likely need x-rays first. Given her history of anxiety and possible claustrophobia we'll give her some kind to take prior to the procedure. #2. Eustachian tube dysfunction. She's been using her Nasonex when necessary. I'll put her on a daily for 4 weeks and see her back.

## 2012-03-16 NOTE — Telephone Encounter (Signed)
LVM for patient to call back. ?

## 2012-03-16 NOTE — Telephone Encounter (Signed)
Dear Paula Aguilar Team Please tell her I have ordered some X RAYS of her shoulder which we have to get before they will approve an MRI. The order is in at [facility] and she can go at her leisure [facility]! Denny Levy PS once I get those bck we will call her about the MRA

## 2012-03-16 NOTE — Progress Notes (Signed)
Dear Cliffton Asters Team Please set up an MR ARTHROGRAM (if Iona Beard questions about how to do this call Jacki Cones at [facility] [phone]) for her RIGT SHOULDER. I have ordered x rays alread so when they ask if we have x rays say YES THANKS! Paula Aguilar

## 2012-03-16 NOTE — Telephone Encounter (Signed)
LVM informing of below 

## 2012-03-21 ENCOUNTER — Telehealth: Payer: Self-pay | Admitting: *Deleted

## 2012-03-21 NOTE — Telephone Encounter (Signed)
Spoke with patient and informed her of appointment I set up for her on Wednesday 03/28/2012 @ [facility] [address] location. patient to arrive at 1:45 pm. When I informed patient of this she said that she would not be able to make that appointment and I gave her number to reschedule this ([phone]). Patient has not gotten the x-rays ordered yet because she stated she had no time to do this. She says she has not time to take off of work yet, but she needs to have these done before MRI can be done. MRI is scheduled I informed patient that she it is "necessary" these get done before that. "I cannot preauthorize her MRI until the date is set"

## 2012-03-26 ENCOUNTER — Ambulatory Visit
Admission: RE | Admit: 2012-03-26 | Discharge: 2012-03-26 | Disposition: A | Discharge status: Home / Self Care | Payer: 59 | Source: Ambulatory Visit | Attending: Family Medicine | Admitting: Family Medicine

## 2012-03-26 DIAGNOSIS — M25511 Pain in right shoulder: Secondary | ICD-10-CM

## 2012-03-27 ENCOUNTER — Telehealth: Payer: Self-pay | Admitting: Family Medicine

## 2012-03-27 NOTE — Telephone Encounter (Signed)
MRI set up for Tuesday 2/11 @ 3:15pm at the [facility] [address] location. Spoke with patient and informed her of appointment she agreed to this and can make that appointment.

## 2012-03-27 NOTE — Telephone Encounter (Signed)
Spoke with patient and she had x-rays done yesterday and now is ready for MRI scheduling

## 2012-03-27 NOTE — Telephone Encounter (Signed)
Patient is calling to speak to nurse regarding her xrays done so she needs to get the MRI rescheduled.

## 2012-03-28 ENCOUNTER — Other Ambulatory Visit: Payer: No Typology Code available for payment source

## 2012-03-30 ENCOUNTER — Telehealth: Payer: Self-pay | Admitting: Family Medicine

## 2012-03-30 NOTE — Telephone Encounter (Signed)
Pt needs orders for her MRI of her shoulder sent to this imaging place (this will be covered by her insurance at no cost to patient) and they need to know today if MRI is a MR arthogram   pls fax orders to: [phone]

## 2012-04-02 NOTE — Telephone Encounter (Signed)
They are calling again - wants to know if they can get orders for this xray

## 2012-04-03 ENCOUNTER — Other Ambulatory Visit: Payer: 59

## 2012-04-03 NOTE — Telephone Encounter (Signed)
I have faxed the order twice Paula Aguilar

## 2012-04-12 ENCOUNTER — Telehealth: Payer: Self-pay | Admitting: *Deleted

## 2012-04-12 ENCOUNTER — Telehealth: Payer: Self-pay | Admitting: Family Medicine

## 2012-04-12 NOTE — Telephone Encounter (Signed)
Spoke with pt re: MRI results.

## 2012-04-12 NOTE — Telephone Encounter (Signed)
Per Dr. Jennette Kettle- called pt and advised she has a rotator cuff tear seen on MRI. Dr. Jennette Kettle wants to refer her to ortho for consult.  Pt is going to check to see who can see with her insurance and call back.

## 2012-04-12 NOTE — Telephone Encounter (Signed)
Pt is asking for results of her MRI

## 2012-04-25 NOTE — Telephone Encounter (Signed)
Left pt a message to return call.

## 2012-05-07 NOTE — Telephone Encounter (Signed)
Scheduled pt with Dr. Dion Saucier 05/09/12 @ 3:15pm.  Left pt a voicemail with appt info. Per M/W they do accept [employer].

## 2012-06-20 ENCOUNTER — Other Ambulatory Visit: Payer: Self-pay | Admitting: Family Medicine

## 2012-06-22 NOTE — Telephone Encounter (Signed)
Called rx and left message informing patient of this

## 2012-06-22 NOTE — Telephone Encounter (Signed)
Dear Cliffton Asters Team Can Tillie Fantasia call this in [facility]! Denny Levy

## 2012-07-24 ENCOUNTER — Telehealth: Payer: Self-pay | Admitting: *Deleted

## 2012-07-24 NOTE — Telephone Encounter (Signed)
Message copied by Mora Bellman on Tue Jul 24, 2012  2:41 PM ------      Message from: Lizbeth Bark      Created: Tue Jul 24, 2012 10:50 AM      Regarding: phone message      Contact: [phone]       Pt would like to discuss referral for rt shoulder. It is now workers comp. ------

## 2012-07-24 NOTE — Telephone Encounter (Signed)
Left pt a VM to return my call  

## 2012-07-25 NOTE — Telephone Encounter (Signed)
Spoke with pt-she states she needs some paper work filled out for [employer].  Asked her to bring paper work to by office and will have Dr. Jennette Kettle review.  Pt agreeable.

## 2012-08-31 ENCOUNTER — Other Ambulatory Visit: Payer: Self-pay | Admitting: Family Medicine

## 2012-09-04 ENCOUNTER — Other Ambulatory Visit: Payer: Self-pay | Admitting: *Deleted

## 2012-09-04 MED ORDER — FUROSEMIDE 20 MG PO TABS: ORAL_TABLET | ORAL | Status: DC

## 2012-09-04 NOTE — Telephone Encounter (Signed)
Requesting RF on Lasix 20mg .  Will fwd to MD.  Radene Ou, CMA

## 2012-09-24 ENCOUNTER — Other Ambulatory Visit: Payer: Self-pay | Admitting: Family Medicine

## 2012-11-14 ENCOUNTER — Other Ambulatory Visit: Payer: Self-pay | Admitting: Family Medicine

## 2012-12-28 ENCOUNTER — Telehealth: Payer: Self-pay | Admitting: Family Medicine

## 2012-12-28 MED ORDER — HYDROCODONE-ACETAMINOPHEN 5-325 MG PO TABS: ORAL_TABLET | ORAL | Status: DC

## 2012-12-28 NOTE — Telephone Encounter (Signed)
Dear Cliffton Asters Team Please call in her norco and tell her it is called in 2) she also requested amitriptylene--I don;t see that ion her med list---I am confused probably---please ask her whatteh dosage was. Then we can send that in as well THANKS! Denny Levy'

## 2012-12-28 NOTE — Telephone Encounter (Signed)
Pt would like a refill on her hydrocodone and amitriptyline. jw

## 2012-12-31 NOTE — Telephone Encounter (Signed)
RX cannot be called in it must be printed

## 2013-01-02 MED ORDER — HYDROCODONE-ACETAMINOPHEN 5-325 MG PO TABS: ORAL_TABLET | ORAL | Status: DC

## 2013-01-02 NOTE — Telephone Encounter (Signed)
Dear Cliffton Asters Team [facility] rx for hydrocodone is at front---please ask her the questoon about the amitriptylene (see previous mssg) THANKS! Denny Levy

## 2013-01-03 NOTE — Telephone Encounter (Signed)
Related message,regarding other medication amitriptyline she states she had it prescribed to her for headaches years ago.I instructed her to schedule appointment since Dr Jennette Kettle as never seen her for this problem.she voiced understanding. Marvelene Stoneberg, Virgel Bouquet

## 2013-03-11 ENCOUNTER — Other Ambulatory Visit: Payer: Self-pay

## 2013-03-11 DIAGNOSIS — Z1231 Encounter for screening mammogram for malignant neoplasm of breast: Secondary | ICD-10-CM

## 2013-03-18 ENCOUNTER — Other Ambulatory Visit: Payer: Self-pay | Admitting: Family Medicine

## 2013-03-26 ENCOUNTER — Ambulatory Visit
Admission: RE | Admit: 2013-03-26 | Discharge: 2013-03-26 | Disposition: A | Discharge status: Home / Self Care | Payer: 59 | Source: Ambulatory Visit

## 2013-03-26 DIAGNOSIS — Z1231 Encounter for screening mammogram for malignant neoplasm of breast: Secondary | ICD-10-CM

## 2013-04-11 ENCOUNTER — Telehealth: Payer: Self-pay | Admitting: Family Medicine

## 2013-04-11 MED ORDER — HYDROCODONE-ACETAMINOPHEN 5-325 MG PO TABS: ORAL_TABLET | ORAL | Status: DC

## 2013-04-11 NOTE — Telephone Encounter (Signed)
  Dear Dema Severin Team  after many unsuccessful attempts at printing this rx I have FINALLY put it up front  plz let her know THANKS! [name]

## 2013-04-11 NOTE — Telephone Encounter (Signed)
Left message on patients voicemail. Paula Aguilar, [name]

## 2013-04-11 NOTE — Telephone Encounter (Signed)
Refill request for Hydrocodone. Please call for pick up

## 2013-05-28 ENCOUNTER — Telehealth: Payer: Self-pay | Admitting: Family Medicine

## 2013-05-28 NOTE — Telephone Encounter (Signed)
Needs prescription for hydrocodone Please call when ready for pickup

## 2013-05-29 MED ORDER — HYDROCODONE-ACETAMINOPHEN 5-325 MG PO TABS: ORAL_TABLET | ORAL | Status: DC

## 2013-05-29 NOTE — Telephone Encounter (Signed)
Patient informed. 

## 2013-05-29 NOTE — Telephone Encounter (Signed)
Dear Dema Severin Team P,lease tell her it is up front at desk [facility] [place] [facility]! Dickie La

## 2013-06-26 ENCOUNTER — Encounter: Payer: Self-pay | Admitting: Family Medicine

## 2013-06-26 ENCOUNTER — Ambulatory Visit (INDEPENDENT_AMBULATORY_CARE_PROVIDER_SITE_OTHER): Payer: 59 | Admitting: Family Medicine

## 2013-06-26 VITALS — BP 142/94 | HR 67 | Temp 98.0°F | Ht 64.0 in | Wt 169.8 lb

## 2013-06-26 DIAGNOSIS — M542 Cervicalgia: Secondary | ICD-10-CM

## 2013-06-26 DIAGNOSIS — Z0000 Encounter for general adult medical examination without abnormal findings: Secondary | ICD-10-CM

## 2013-06-26 DIAGNOSIS — R5383 Other fatigue: Secondary | ICD-10-CM

## 2013-06-26 DIAGNOSIS — R5381 Other malaise: Secondary | ICD-10-CM

## 2013-06-26 LAB — CBC WITH DIFFERENTIAL/PLATELET
BASOS ABS: 0 10*3/uL (ref 0.0–0.1)
BASOS PCT: 0 % (ref 0–1)
EOS PCT: 2 % (ref 0–5)
Eosinophils Absolute: 0.2 10*3/uL (ref 0.0–0.7)
HEMATOCRIT: 42.9 % (ref 36.0–46.0)
Hemoglobin: 14.9 g/dL (ref 12.0–15.0)
LYMPHS PCT: 24 % (ref 12–46)
Lymphs Abs: 1.9 10*3/uL (ref 0.7–4.0)
MCH: 30.9 pg (ref 26.0–34.0)
MCHC: 34.7 g/dL (ref 30.0–36.0)
MCV: 89 fL (ref 78.0–100.0)
MONO ABS: 0.6 10*3/uL (ref 0.1–1.0)
Monocytes Relative: 7 % (ref 3–12)
Neutro Abs: 5.4 10*3/uL (ref 1.7–7.7)
Neutrophils Relative %: 67 % (ref 43–77)
Platelets: 307 10*3/uL (ref 150–400)
RBC: 4.82 MIL/uL (ref 3.87–5.11)
RDW: 13.5 % (ref 11.5–15.5)
WBC: 8 10*3/uL (ref 4.0–10.5)

## 2013-06-26 LAB — COMPREHENSIVE METABOLIC PANEL
ALT: 10 U/L (ref 0–35)
AST: 21 U/L (ref 0–37)
Albumin: 3 g/dL — ABNORMAL LOW (ref 3.5–5.2)
Alkaline Phosphatase: 33 U/L — ABNORMAL LOW (ref 39–117)
BUN: 17 mg/dL (ref 6–23)
CALCIUM: 8.1 mg/dL — AB (ref 8.4–10.5)
CHLORIDE: 107 meq/L (ref 96–112)
CO2: 28 mEq/L (ref 19–32)
CREATININE: 0.76 mg/dL (ref 0.50–1.10)
GLUCOSE: 99 mg/dL (ref 70–99)
Potassium: 4.5 mEq/L (ref 3.5–5.3)
Sodium: 141 mEq/L (ref 135–145)
Total Bilirubin: 0.4 mg/dL (ref 0.2–1.2)
Total Protein: 4.7 g/dL — ABNORMAL LOW (ref 6.0–8.3)

## 2013-06-26 LAB — TSH: TSH: 2.961 u[IU]/mL (ref 0.350–4.500)

## 2013-06-26 MED ORDER — MOMETASONE FUROATE 50 MCG/ACT NA SUSP: 2.0000 | Freq: Every day | NASAL | Status: DC

## 2013-06-26 NOTE — Progress Notes (Signed)
   Subjective:    Patient ID: Paula Aguilar, female    DOB: 10/01/1955, 58 y.o.   MRN: [id number]  HPI Here for well check up. She's been doing pretty well. Just had left knee arthroscopic surgery by Dr. Para March. Feels like it's coming along. She's been out of work for a while and is little depressed but she's just sitting around the house. Anxious to get back to work.   Review of Systems  Constitutional: Negative for appetite change and unexpected weight change.  HENT: Positive for rhinorrhea.   Eyes: Positive for itching. Negative for pain.  Respiratory: Negative for cough, shortness of breath and wheezing.   Gastrointestinal: Negative for abdominal pain, diarrhea, constipation and blood in stool.  Genitourinary: Negative for hematuria, vaginal bleeding, menstrual problem and pelvic pain.  Neurological: Negative for dizziness, syncope and weakness.       Objective:   Physical Exam  Constitutional: She is oriented to person, place, and time. She appears well-developed and well-nourished.  HENT:  Head: Normocephalic and atraumatic.  Mild retraction and bilateral TMs but otherwise normal.  Eyes: Conjunctivae and EOM are normal. Pupils are equal, round, and reactive to light. Right eye exhibits no discharge. Left eye exhibits no discharge. No scleral icterus.  Neck: Normal range of motion. Neck supple. No JVD present. No thyromegaly present.  Cardiovascular: Normal rate, regular rhythm, normal heart sounds and intact distal pulses.   Pulmonary/Chest: Effort normal and breath sounds normal. She has no wheezes.  Lymphadenopathy:    She has no cervical adenopathy.  Neurological: She is alert and oriented to person, place, and time. She has normal reflexes.  Skin: No rash noted.  Psychiatric: She has a normal mood and affect. Her behavior is normal. Thought content normal.          Assessment & Plan:

## 2013-06-26 NOTE — Patient Instructions (Signed)
I refilled your nasal spray. Use it daily. If your ringing in your ears does not go away, see me in a month.  I will send you a note about your labs.

## 2013-06-27 ENCOUNTER — Encounter: Payer: Self-pay | Admitting: Family Medicine

## 2013-06-28 NOTE — Assessment & Plan Note (Signed)
Well woman checkup today. She's doing pretty well. Just had her knee surgery done she is a little down about being out of work but says she doesn't think she needs to consider any type of medication for that. Her Pap smear is up-to-date with the next one due next year. Her colonoscopy is in day. Mammogram is in day.

## 2013-07-04 ENCOUNTER — Ambulatory Visit
Admission: RE | Admit: 2013-07-04 | Discharge: 2013-07-04 | Disposition: A | Discharge status: Home / Self Care | Payer: 59 | Source: Ambulatory Visit | Attending: Family Medicine | Admitting: Family Medicine

## 2013-07-04 DIAGNOSIS — M542 Cervicalgia: Secondary | ICD-10-CM

## 2013-09-02 ENCOUNTER — Other Ambulatory Visit: Payer: Self-pay | Admitting: Family Medicine

## 2013-11-06 ENCOUNTER — Ambulatory Visit (INDEPENDENT_AMBULATORY_CARE_PROVIDER_SITE_OTHER): Payer: No Typology Code available for payment source | Admitting: Family Medicine

## 2013-11-06 VITALS — BP 138/90 | HR 69 | Temp 98.1°F | Wt 176.7 lb

## 2013-11-06 DIAGNOSIS — J45909 Unspecified asthma, uncomplicated: Secondary | ICD-10-CM

## 2013-11-06 DIAGNOSIS — R609 Edema, unspecified: Secondary | ICD-10-CM

## 2013-11-06 DIAGNOSIS — M25519 Pain in unspecified shoulder: Secondary | ICD-10-CM

## 2013-11-06 DIAGNOSIS — Z23 Encounter for immunization: Secondary | ICD-10-CM

## 2013-11-06 DIAGNOSIS — M25511 Pain in right shoulder: Secondary | ICD-10-CM

## 2013-11-06 MED ORDER — MONTELUKAST SODIUM 10 MG PO TABS: 10.0000 mg | ORAL_TABLET | Freq: Every day | ORAL | Status: DC

## 2013-11-06 MED ORDER — SPIRONOLACTONE 25 MG PO TABS: ORAL_TABLET | ORAL | Status: DC

## 2013-11-08 NOTE — Assessment & Plan Note (Signed)
I think most of what she's sitting in her chest is some mild allergic reaction. She had stopped her Singulair saw put her back on that see how she does. Followup one to 2 months.

## 2013-11-08 NOTE — Progress Notes (Signed)
   Subjective:    Patient ID: Paula Aguilar, female    DOB: [birth year], 57 y.o.   MRN: [id number]  HPI #1. Increased feeling of tightness in her chest since about the last 3 weeks. She thinks it may be related to allergy change. She's also had more nasal drainage. She's not had to use her inhaler. Just feels like she can always give deep breath. Happens mostly when she is not dizzy as and when she's sitting at home. Has not noticed a has much when she's up and active. #2. Continues to have some shoulder pain. Especially difficult reaching overhead or backward. She's been taking some Eloxatin and that seems to help. #3. Having some lower extremity swelling. I had given her some furosemide in the past to use that every time she took it she had cramps in her legs. Feet swell mostly at night lateral portion of the day and resolve overnight. It's worse if she is on her feet all day particular standing.   Review of Systems No unusual weight change, no fever, sweats, chills, palpitations. No change in exercise tolerance.    Objective:   Physical Exam  Vital signs reviewed. GENERAL: Well-developed, well-nourished, no acute distress. CARDIOVASCULAR: Regular rate and rhythm no murmur gallop or rub LUNGS: Clear to auscultation bilaterally, no rales or wheeze. ABDOMEN: Soft positive bowel sounds NEURO: No gross focal neurological deficits. MSK: Movement of extremity x 4. Mild nonpitting edema bilateral feet and ankles.        Assessment & Plan:  Shoulder pain. At this time out continue meloxicam. She'll get back, schedule so we can do an ultrasound of her tender cuff.

## 2013-11-08 NOTE — Assessment & Plan Note (Signed)
Dependent edema this was worsened when she's on her feet all day. I had given her Lasix and it caused leg cramps. We'll try low-dose prophylactic time.

## 2013-11-29 ENCOUNTER — Ambulatory Visit: Payer: 59 | Admitting: Family Medicine

## 2013-12-11 ENCOUNTER — Encounter: Payer: Self-pay | Admitting: Family Medicine

## 2013-12-11 ENCOUNTER — Ambulatory Visit (INDEPENDENT_AMBULATORY_CARE_PROVIDER_SITE_OTHER): Payer: 59 | Admitting: Family Medicine

## 2013-12-11 VITALS — BP 135/82 | HR 68 | Temp 97.8°F | Ht 64.0 in | Wt 175.2 lb

## 2013-12-11 DIAGNOSIS — R0600 Dyspnea, unspecified: Secondary | ICD-10-CM

## 2013-12-11 DIAGNOSIS — F4323 Adjustment disorder with mixed anxiety and depressed mood: Secondary | ICD-10-CM

## 2013-12-11 DIAGNOSIS — J4520 Mild intermittent asthma, uncomplicated: Secondary | ICD-10-CM

## 2013-12-11 MED ORDER — ALBUTEROL SULFATE HFA 108 (90 BASE) MCG/ACT IN AERS: 2.0000 | INHALATION_SPRAY | Freq: Two times a day (BID) | RESPIRATORY_TRACT | Status: DC | PRN

## 2013-12-11 MED ORDER — FLUTICASONE-SALMETEROL 250-50 MCG/DOSE IN AEPB: 1.0000 | INHALATION_SPRAY | Freq: Two times a day (BID) | RESPIRATORY_TRACT | Status: DC

## 2013-12-11 MED ORDER — PREDNISONE 10 MG PO TABS: ORAL_TABLET | ORAL | Status: DC

## 2013-12-11 NOTE — Progress Notes (Signed)
Form placed in PCP box for completion.Paula Aguilar  

## 2013-12-11 NOTE — Progress Notes (Signed)
   Subjective:    Patient ID: Paula Aguilar, female    DOB: October 31, 1955, 58 y.o.   MRN: [id number]  HPI Complaint is at increased wheezing particularly at night. She's out of her inhalers. She feels like she is "gasping for breath". Has not had any runny nose or unusual cough. No fever.   Review of Systems See history of present illness.    Objective:   Physical Exam Pulse ox in room air is 99%. GENERAL: Well-developed female no acute distress. She appears to be somewhat anxious and worried. LUNGS: Very occasional scattered expiratory wheeze at the left base otherwise clear to auscultation. She is breathing with very short frequent respirations. PSYCHIATRIC: Alert and oriented x4. No suicidal or homicidal ideation. Her recent and remote memory is intact. Her affect is mildly anxious. Her speech is normal in content and fluency.       Assessment & Plan:  Dyspnea. She thinks this is related to an asthma exacerbation. I refilled her prescription for inhalers and I gave her a five-day very low dose of steroids because of most of her symptoms seem to be at night sao I may be underestimating her exacerbation. It seems that a lot of her shortness of breath is coming from anxiety but she does not believe that. I worked with  her today demonstrating and having her do  some timed breathing exercises and she was able to do those but right after we stopped the intervention,  she return to her short intermittent breathing style. She does have a long history of anxiety. I would have her follow up with she's not getting better in the next 7-10 days

## 2013-12-11 NOTE — Progress Notes (Signed)
Patient dropped off FMLA forms to be filled out.  Please call her when completed. °

## 2013-12-17 NOTE — Progress Notes (Signed)
Left voice message for pt informing her that FMLA paperwork is complete and ready for pick up.  Forms copied for scanning in pt's record.  Derl Barrow, RN

## 2013-12-17 NOTE — Progress Notes (Signed)
Patient ID: Paula Aguilar, female   DOB: 1955-05-04, 58 y.o.   MRN: [id number] Done  Copy in chart [name]

## 2014-01-22 ENCOUNTER — Other Ambulatory Visit: Payer: Self-pay | Admitting: *Deleted

## 2014-01-23 MED ORDER — MONTELUKAST SODIUM 10 MG PO TABS: 10.0000 mg | ORAL_TABLET | Freq: Every day | ORAL | Status: DC

## 2014-09-01 ENCOUNTER — Other Ambulatory Visit: Payer: Self-pay | Admitting: *Deleted

## 2014-09-01 MED ORDER — HYOSCYAMINE SULFATE 0.125 MG PO TBDP
ORAL_TABLET | ORAL | Status: DC
Start: 2014-09-01 — End: 2016-03-16

## 2014-09-03 ENCOUNTER — Ambulatory Visit (INDEPENDENT_AMBULATORY_CARE_PROVIDER_SITE_OTHER): Payer: 59 | Admitting: Family Medicine

## 2014-09-03 ENCOUNTER — Encounter: Payer: Self-pay | Admitting: Family Medicine

## 2014-09-03 VITALS — BP 153/98 | HR 68 | Temp 97.7°F | Ht 64.0 in | Wt 184.8 lb

## 2014-09-03 DIAGNOSIS — J309 Allergic rhinitis, unspecified: Secondary | ICD-10-CM

## 2014-09-03 DIAGNOSIS — K219 Gastro-esophageal reflux disease without esophagitis: Secondary | ICD-10-CM

## 2014-09-03 DIAGNOSIS — R21 Rash and other nonspecific skin eruption: Secondary | ICD-10-CM

## 2014-09-03 MED ORDER — OMEPRAZOLE 40 MG PO CPDR: 40.0000 mg | DELAYED_RELEASE_CAPSULE | Freq: Two times a day (BID) | ORAL | Status: DC

## 2014-09-03 MED ORDER — MOMETASONE FUROATE 50 MCG/ACT NA SUSP: 2.0000 | Freq: Two times a day (BID) | NASAL | Status: DC

## 2014-09-03 MED ORDER — DOXYCYCLINE HYCLATE 100 MG PO TBEC: 100.0000 mg | DELAYED_RELEASE_TABLET | Freq: Two times a day (BID) | ORAL | Status: DC

## 2014-09-03 NOTE — Progress Notes (Signed)
   Subjective:    Patient ID: Paula Aguilar, female    DOB: Mar 04, 1955, 59 y.o.   MRN: [id number]  HPI #1. Recurrence of her reflux symptoms. Is having problems most days, to 3 times a day, worse with eating. Having a lot of belching. Having a lot of burning in her esophagus. This worries her because she can never be sure it is not her heart. #2. Left ear pain. One to 2 on scale of 1-10. Says is pretty constant, achy. No problems hearing. No sore throat. She does have some allergy type symptoms with runny nose that is intermittent. #3. Rash. Started 4 days ago. She was bitten by an insect while outside. Got a rash around that area and then it went away and the following day she broke out in some bumps on her stomach. They're quite itchy.   Review of Systems No fever, sweats, chills, unusual weight change. See history of present illness.    Objective:   Physical Exam  Vital signs reviewed. GENERAL: Well-developed, well-nourished, no acute distress. CARDIOVASCULAR: Regular rate and rhythm no murmur gallop or rub LUNGS: Clear to auscultation bilaterally, no rales or wheeze. ABDOMEN: Soft positive bowel sounds NEURO: No gross focal neurological deficits. MSK: Movement of extremity x 4. SKIN: Multiple papules with a few pustules on her abdomen. There are none on her back, none on her extremities. HEENT: Bilateral TMs are mildly retracted but have good landmarks. Oropharynx is without any sign of exudate or erythema. Neck is that any lymphadenopathy. Conjunctivae nonicteric.      Assessment & Plan:

## 2014-09-03 NOTE — Assessment & Plan Note (Signed)
I think her left ear pain is from eustachian tube dysfunction. I will restart her on her nasal Sterrett.

## 2014-09-03 NOTE — Assessment & Plan Note (Signed)
We had treated her in the past with proton pump inhibitor but she is evidently not taking that right now. I'll put her on twice a day dosing and see her back in 4 weeks. Not improving, we probably need to consider EGD etc. area

## 2014-10-01 ENCOUNTER — Ambulatory Visit: Payer: 59 | Admitting: Family Medicine

## 2014-10-29 ENCOUNTER — Ambulatory Visit (INDEPENDENT_AMBULATORY_CARE_PROVIDER_SITE_OTHER): Payer: 59 | Admitting: Family Medicine

## 2014-10-29 ENCOUNTER — Encounter: Payer: Self-pay | Admitting: Family Medicine

## 2014-10-29 VITALS — BP 161/100 | HR 77 | Temp 97.7°F | Ht 64.0 in | Wt 187.5 lb

## 2014-10-29 DIAGNOSIS — Z9189 Other specified personal risk factors, not elsewhere classified: Secondary | ICD-10-CM

## 2014-10-29 DIAGNOSIS — K219 Gastro-esophageal reflux disease without esophagitis: Secondary | ICD-10-CM | POA: Diagnosis not present

## 2014-10-29 DIAGNOSIS — F449 Dissociative and conversion disorder, unspecified: Secondary | ICD-10-CM

## 2014-10-29 NOTE — Patient Instructions (Signed)
Decrease your  stomach medicine to once a day. IF Symptoms start coming back, go back to the twice a day dose. See me in 4-6 weeks so we can follow this up. If symptoms suddenly get worse, call me, we will consider doing a scope on your stomach. I will send in your prescriptions. Great to see you !Marland Kitchen

## 2014-10-30 DIAGNOSIS — Z9189 Other specified personal risk factors, not elsewhere classified: Secondary | ICD-10-CM | POA: Insufficient documentation

## 2014-10-30 NOTE — Assessment & Plan Note (Signed)
She continues to have some sensation of something stuck in her throat. I think part of this is related to postnasal drainage. Potentially part of this could also have been related to reflux as this symptom has improved but not resolved with the addition of twice daily PPI. We'll continue to treat with proton pump inhibitor therapy but decreased to once a day. See patient instructions. Should symptoms worsen or not resolve, I would consider endoscopy.

## 2014-10-30 NOTE — Progress Notes (Signed)
   Subjective:    Patient ID: Paula Aguilar, female    DOB: January 04, 1956, 59 y.o.   MRN: [id number]  HPI Follow-up reflux symptoms. Much improved on the twice daily PPI. Still has some sensation of something caught in her throat at times.  Having some stressors at home. Her 91 year old daughter noted to have a mass in her chest on some type of imaging study. The original diagnosis of massive chest was quite a few months ago and she put off getting any follow-up until today. This is been extremely stressful for [facility]. She thinks that's why her blood pressures a little bit elevated today.   Review of Systems Improve reflux symptoms. No fever, sweats, chills. Has had some mild weight gain. No appetite change. No nausea, no diarrhea, no vomiting.    Objective:   Physical Exam Vital signs are reviewed and elevated blood pressures noted repeat blood pressures 152/85. GEN.: Well-developed female no acute distress NECK: No lymphadenopathy no thyromegaly, soft, supple, full range of motion GI: Abdomen is soft, positive bowel sounds nontender nondistended       Assessment & Plan:

## 2014-10-30 NOTE — Assessment & Plan Note (Signed)
She reports colonoscopy at age 59 but I can not find the report. Her last mammogram was greater than 2 years ago. We'll discuss these issues again when I see her in follow-up. In about a month.

## 2014-10-30 NOTE — Assessment & Plan Note (Signed)
See patient instructions

## 2015-01-13 ENCOUNTER — Other Ambulatory Visit: Payer: Self-pay | Admitting: *Deleted

## 2015-01-13 MED ORDER — CONJ ESTROG-MEDROXYPROGEST ACE 0.3-1.5 MG PO TABS: 1.0000 | ORAL_TABLET | Freq: Every day | ORAL | Status: DC

## 2015-03-25 ENCOUNTER — Ambulatory Visit (INDEPENDENT_AMBULATORY_CARE_PROVIDER_SITE_OTHER): Payer: 59 | Admitting: Family Medicine

## 2015-03-25 ENCOUNTER — Encounter: Payer: Self-pay | Admitting: Family Medicine

## 2015-03-25 VITALS — BP 154/94 | HR 68 | Temp 97.8°F | Ht 64.0 in | Wt 180.3 lb

## 2015-03-25 DIAGNOSIS — R030 Elevated blood-pressure reading, without diagnosis of hypertension: Secondary | ICD-10-CM

## 2015-03-25 DIAGNOSIS — M25561 Pain in right knee: Secondary | ICD-10-CM | POA: Diagnosis not present

## 2015-03-25 DIAGNOSIS — Z0000 Encounter for general adult medical examination without abnormal findings: Secondary | ICD-10-CM | POA: Diagnosis not present

## 2015-03-25 LAB — CBC
HCT: 45.6 % (ref 36.0–46.0)
Hemoglobin: 15.3 g/dL — ABNORMAL HIGH (ref 12.0–15.0)
MCH: 30.7 pg (ref 26.0–34.0)
MCHC: 33.6 g/dL (ref 30.0–36.0)
MCV: 91.6 fL (ref 78.0–100.0)
MPV: 10.4 fL (ref 8.6–12.4)
PLATELETS: 291 10*3/uL (ref 150–400)
RBC: 4.98 MIL/uL (ref 3.87–5.11)
RDW: 13.5 % (ref 11.5–15.5)
WBC: 7.3 10*3/uL (ref 4.0–10.5)

## 2015-03-25 LAB — COMPLETE METABOLIC PANEL WITH GFR
ALT: 8 U/L (ref 6–29)
AST: 20 U/L (ref 10–35)
Albumin: 3 g/dL — ABNORMAL LOW (ref 3.6–5.1)
Alkaline Phosphatase: 38 U/L (ref 33–130)
BILIRUBIN TOTAL: 0.4 mg/dL (ref 0.2–1.2)
BUN: 18 mg/dL (ref 7–25)
CALCIUM: 8.4 mg/dL — AB (ref 8.6–10.4)
CO2: 24 mmol/L (ref 20–31)
CREATININE: 0.74 mg/dL (ref 0.50–1.05)
Chloride: 106 mmol/L (ref 98–110)
GFR, Est Non African American: >89 mL/min (ref >=60)
Glucose, Bld: 95 mg/dL (ref 65–99)
Potassium: 4.4 mmol/L (ref 3.5–5.3)
Sodium: 141 mmol/L (ref 135–146)
TOTAL PROTEIN: 4.8 g/dL — AB (ref 6.1–8.1)

## 2015-03-25 LAB — LDL CHOLESTEROL, DIRECT: Direct LDL: 157 mg/dL — ABNORMAL HIGH (ref <130)

## 2015-03-25 MED ORDER — HYDROCODONE-ACETAMINOPHEN 5-325 MG PO TABS: ORAL_TABLET | ORAL | Status: DC

## 2015-03-25 NOTE — Patient Instructions (Signed)
Please get your mammogram in teh next few weeks. I will send you a note about your blood work. Great to see you!

## 2015-03-26 NOTE — Assessment & Plan Note (Signed)
Urged her to get her mammogram. Her flu shot is complete. We discussed her Pap smear she decided to wait the full 5 years before she repeats that so we'll get that when she's at age 60. She's never had abnormal Pap she is asymptomatic, and long-term monogamous relationship. Discussed calcium and exercise.

## 2015-03-26 NOTE — Progress Notes (Addendum)
   Subjective:    Patient ID: Paula Aguilar, female    DOB: February 03, 1956, 60 y.o.   MRN: [id number]  HPI Her for CPE. The Sesame she she wants to go over. #1. Has a lesion on her right fifth finger that's been there for about a month and seems to be getting bigger. It bothers her quite a bit #2. She's just completed her second series of Visco supplementation for her right knee. She's had some improved symptoms. She occasionally gets corticosteroid shots in her left knee, these are done by orthopedics #3. Mid back pain right in the rhomboid area. Also some occasional sharp shooting pains from her neck to her upper extremity. These happen most often after she's had a long day at work. If she does the exercises we discussed, they improved.   Review of Systems  Constitutional: Negative for fever, activity change and appetite change.  HENT: Positive for tinnitus.   Respiratory: Negative for cough, choking, shortness of breath and wheezing.   Cardiovascular: Negative for chest pain, palpitations and leg swelling.  Gastrointestinal: Negative for nausea, diarrhea, constipation and blood in stool.  Genitourinary: Negative for dysuria, vaginal bleeding and vaginal discharge.  Neurological: Negative for speech difficulty, weakness and light-headedness.  Psychiatric/Behavioral: Negative for confusion, dysphoric mood and agitation.       Objective:   Physical Exam  Constitutional: She is oriented to person, place, and time. She appears well-developed and well-nourished.  HENT:  Head: Normocephalic and atraumatic.  Right Ear: External ear normal.  Left Ear: External ear normal.  Nose: Nose normal.  Mouth/Throat: Oropharynx is clear and moist.  Eyes: Conjunctivae and EOM are normal. Pupils are equal, round, and reactive to light. Right eye exhibits no discharge. Left eye exhibits no discharge. No scleral icterus.  Neck: Normal range of motion. No JVD present. No thyromegaly present.  Cardiovascular:  Normal rate, regular rhythm, normal heart sounds and intact distal pulses.   No murmur heard. Pulmonary/Chest: Effort normal and breath sounds normal. She has no wheezes.  Abdominal: Soft. Bowel sounds are normal.  Musculoskeletal: Normal range of motion. She exhibits no edema.  Lymphadenopathy:    She has no cervical adenopathy.  Neurological: She is alert and oriented to person, place, and time. She has normal reflexes. She displays normal reflexes. No cranial nerve deficit. Coordination normal.  Skin: Skin is warm and dry.  Right fifth finger on the lateral portion of the proximal phalanx is a firm 6 mm in diameter raised red nodule.          Assessment & Plan:  Regarding a nodule on her fifth right finger. I think we need to either biopsy and/or excise this and I'll see her back in 2-3 weeks for that. We scheduled the appointment today

## 2015-03-30 ENCOUNTER — Encounter: Payer: Self-pay | Admitting: Family Medicine

## 2015-03-30 DIAGNOSIS — E7800 Pure hypercholesterolemia, unspecified: Secondary | ICD-10-CM | POA: Insufficient documentation

## 2015-04-02 ENCOUNTER — Encounter: Payer: Self-pay | Admitting: Family Medicine

## 2015-04-02 ENCOUNTER — Ambulatory Visit (INDEPENDENT_AMBULATORY_CARE_PROVIDER_SITE_OTHER): Payer: 59 | Admitting: Family Medicine

## 2015-04-02 VITALS — BP 153/92 | HR 64 | Temp 97.9°F | Ht 64.0 in | Wt 180.0 lb

## 2015-04-02 DIAGNOSIS — D489 Neoplasm of uncertain behavior, unspecified: Secondary | ICD-10-CM | POA: Diagnosis not present

## 2015-04-02 NOTE — Progress Notes (Signed)
Patient ID: Paula Aguilar, female   DOB: 16-Aug-1955, 60 y.o.   MRN: [id number] Here for removal of lesion right little finger. Diameter 6 mm PROCEDURE NOTE: Patient given informed consent, signed copy in the chart. Appropriate time out taken, Right pinky finger prepped and draped in usual sterile fashion. 2 cc of lidocaine with epinephrine used to obtain local anesthesia. A derma-blade was used to shave the nodular lesion. Lesion sent for pathology. A small curette was used to smooth any remaining tissue. Bleeding was minimal and a small amount of Monsel's solution was used to help obtain hemostasis, A small amount of antibiotic appointment and a band aid was applied. No complications,estimated blood loss less than 2 cc. Patient tolerated procedure well. Post procedure instructions given and she is written out of work until Monday, letter given.

## 2015-04-07 ENCOUNTER — Telehealth: Payer: Self-pay | Admitting: Family Medicine

## 2015-04-07 ENCOUNTER — Encounter: Payer: Self-pay | Admitting: Family Medicine

## 2015-04-07 NOTE — Telephone Encounter (Signed)
Dear Paula Aguilar Team Please let her know the lesion I took off was a common wart--I am sending her a letter. We do not need to do anything else at this time [facility]! Paula Aguilar

## 2015-04-08 NOTE — Telephone Encounter (Signed)
LVM for pt to call the office. Diamantina Edinger T Anielle Headrick, CMA  

## 2015-04-13 ENCOUNTER — Telehealth: Payer: Self-pay | Admitting: Family Medicine

## 2015-04-13 NOTE — Telephone Encounter (Signed)
LVM for pt to call back to inform her of below. Zimmerman Rumple, April D, CMA  

## 2015-04-13 NOTE — Telephone Encounter (Signed)
Pt called back and was told about the letter sent out and the results. Patient had already received the letter and was aware. jw

## 2015-04-14 NOTE — Telephone Encounter (Signed)
Pt informed of below and stated that she received the letter as well. Katharina Caper, April D, [place]

## 2015-04-20 ENCOUNTER — Other Ambulatory Visit: Payer: Self-pay | Admitting: *Deleted

## 2015-04-20 MED ORDER — MONTELUKAST SODIUM 10 MG PO TABS: 10.0000 mg | ORAL_TABLET | Freq: Every day | ORAL | Status: DC

## 2015-05-20 ENCOUNTER — Ambulatory Visit (INDEPENDENT_AMBULATORY_CARE_PROVIDER_SITE_OTHER): Payer: 59 | Admitting: Family Medicine

## 2015-05-20 ENCOUNTER — Encounter: Payer: Self-pay | Admitting: Family Medicine

## 2015-05-20 VITALS — BP 152/90 | HR 70 | Temp 98.0°F | Ht 64.0 in | Wt 178.0 lb

## 2015-05-20 DIAGNOSIS — M545 Low back pain, unspecified: Secondary | ICD-10-CM

## 2015-05-20 DIAGNOSIS — R5381 Other malaise: Secondary | ICD-10-CM | POA: Diagnosis not present

## 2015-05-20 DIAGNOSIS — M6248 Contracture of muscle, other site: Secondary | ICD-10-CM

## 2015-05-20 DIAGNOSIS — M62838 Other muscle spasm: Secondary | ICD-10-CM

## 2015-05-20 DIAGNOSIS — R350 Frequency of micturition: Secondary | ICD-10-CM | POA: Diagnosis not present

## 2015-05-20 LAB — BASIC METABOLIC PANEL WITH GFR
BUN: 18 mg/dL (ref 7–25)
CHLORIDE: 108 mmol/L (ref 98–110)
CO2: 28 mmol/L (ref 20–31)
CREATININE: 0.69 mg/dL (ref 0.50–1.05)
Calcium: 8.6 mg/dL (ref 8.6–10.4)
GFR, Est African American: >89 mL/min (ref >=60)
GFR, Est Non African American: >89 mL/min (ref >=60)
Glucose, Bld: 95 mg/dL (ref 65–99)
POTASSIUM: 4.9 mmol/L (ref 3.5–5.3)
SODIUM: 142 mmol/L (ref 135–146)

## 2015-05-20 MED ORDER — CYCLOBENZAPRINE HCL 5 MG PO TABS: 5.0000 mg | ORAL_TABLET | Freq: Every day | ORAL | Status: DC

## 2015-05-20 NOTE — Patient Instructions (Signed)
I will cal you if there is something abnormal on your labs.Take the cyclobenzaprine at night for a few nights and see if we can get the residual headache and the back spasm taken care of.

## 2015-05-21 ENCOUNTER — Encounter: Payer: Self-pay | Admitting: Family Medicine

## 2015-05-21 LAB — URINALYSIS W MICROSCOPIC + REFLEX CULTURE
BACTERIA UA: NONE SEEN [HPF]
Bilirubin Urine: NEGATIVE
Casts: NONE SEEN [LPF]
GLUCOSE, UA: NEGATIVE
Hgb urine dipstick: NEGATIVE
Ketones, ur: NEGATIVE
Nitrite: NEGATIVE
PROTEIN: NEGATIVE
RBC / HPF: NONE SEEN RBC/HPF (ref <=2)
SPECIFIC GRAVITY, URINE: 1.023 (ref 1.001–1.035)
Yeast: NONE SEEN [HPF]
pH: 5 (ref 5.0–8.0)

## 2015-05-21 NOTE — Progress Notes (Signed)
   Subjective:    Patient ID: Paula Aguilar, female    DOB: [birth year], 60 y.o.   MRN: [id number]  HPI Chief complaint: Low back pain and neck pain. Back pain is mostly in the left lower back. Started 4 days ago. She doesn't recall doing anything specific that would have flared it up. She has a lot of stiffness. Shooting pain down to the buttock area. No bowel or bladder incontinence. Does not radiate to the leg. Worse if she leans forward or tries to hyperextend. Also aggravated by prolonged sitting.  #2. Neck pain. She had her typical migraine last week and after that she just never resolve the neck spasms that typically go with her headache. Pain is 1-2 out of 10 currently. The other headache symptoms resolved  #3. Malaise. Says she just does not feel as well last week. Tired, achy in general. Denies cough, shortness of breath, fever or chills. Has had a little bit of an upset stomach and one episode of diarrhea. Things to have increased frequency of urination but no burning. No unusual's thirst  Review of Systems See history of present illness    Objective:   Physical Exam Vital signs reviewed. GENERAL: Well-developed, well-nourished, no acute distress. CARDIOVASCULAR: Regular rate and rhythm no murmur gallop or rub LUNGS: Clear to auscultation bilaterally, no rales or wheeze. ABDOMEN: Soft positive bowel sounds NEURO: No gross focal neurological deficits. MSK: Movement of extremity x 4. Hip flexor, dorsiflexion plantar flexion all 5 out of 5 bilaterally symmetrical BACK: Lumbar muscle spasm left greater than right but bilateral. Limited hyperextension and flexion secondary to pain and spasm. Lateral rotation also limited. Straight leg raise is negative bilaterally in seated and supine position. NECK: Full range of motion. Mild tenderness to palpation in the trapezius particular on the left. This reproduces her pain. Negative Spurling's.         Assessment & Plan:  #1. Low back  pain. Seems musculoskeletal as does her neck pain. I will treat her with muscle relaxers for the next 7-10 days. Reassured her. #2Lucky Rathke. We'll check some blood work including urinalysis that she is also reporting some increased frequency of urination. I suspect she had a mild viral illness.

## 2015-05-22 LAB — URINE CULTURE
Colony Count: NO GROWTH
Organism ID, Bacteria: NO GROWTH

## 2015-07-10 ENCOUNTER — Other Ambulatory Visit: Payer: Self-pay | Admitting: *Deleted

## 2015-07-10 MED ORDER — MONTELUKAST SODIUM 10 MG PO TABS: 10.0000 mg | ORAL_TABLET | Freq: Every day | ORAL | Status: DC

## 2015-07-14 ENCOUNTER — Other Ambulatory Visit: Payer: Self-pay | Admitting: *Deleted

## 2015-07-14 DIAGNOSIS — M545 Low back pain, unspecified: Secondary | ICD-10-CM

## 2015-07-14 DIAGNOSIS — M62838 Other muscle spasm: Secondary | ICD-10-CM

## 2015-07-14 MED ORDER — CYCLOBENZAPRINE HCL 5 MG PO TABS: 5.0000 mg | ORAL_TABLET | Freq: Every day | ORAL | Status: DC

## 2015-08-28 ENCOUNTER — Other Ambulatory Visit: Payer: Self-pay | Admitting: Family Medicine

## 2015-10-11 ENCOUNTER — Other Ambulatory Visit: Payer: Self-pay | Admitting: Family Medicine

## 2015-10-11 DIAGNOSIS — M545 Low back pain, unspecified: Secondary | ICD-10-CM

## 2015-10-11 DIAGNOSIS — M62838 Other muscle spasm: Secondary | ICD-10-CM

## 2015-12-07 ENCOUNTER — Other Ambulatory Visit: Payer: Self-pay | Admitting: Family Medicine

## 2015-12-07 DIAGNOSIS — M62838 Other muscle spasm: Secondary | ICD-10-CM

## 2015-12-07 DIAGNOSIS — M545 Low back pain, unspecified: Secondary | ICD-10-CM

## 2015-12-26 ENCOUNTER — Other Ambulatory Visit: Payer: Self-pay | Admitting: Family Medicine

## 2016-03-09 ENCOUNTER — Ambulatory Visit: Payer: 59 | Admitting: Family Medicine

## 2016-03-15 ENCOUNTER — Other Ambulatory Visit: Payer: Self-pay | Admitting: Family Medicine

## 2016-03-15 DIAGNOSIS — M545 Low back pain, unspecified: Secondary | ICD-10-CM

## 2016-03-15 DIAGNOSIS — M62838 Other muscle spasm: Secondary | ICD-10-CM

## 2016-03-16 ENCOUNTER — Ambulatory Visit (INDEPENDENT_AMBULATORY_CARE_PROVIDER_SITE_OTHER): Payer: Managed Care, Other (non HMO) | Admitting: Family Medicine

## 2016-03-16 ENCOUNTER — Other Ambulatory Visit: Payer: Self-pay | Admitting: Family Medicine

## 2016-03-16 DIAGNOSIS — Z23 Encounter for immunization: Secondary | ICD-10-CM

## 2016-03-16 DIAGNOSIS — I10 Essential (primary) hypertension: Secondary | ICD-10-CM

## 2016-03-16 LAB — COMPLETE METABOLIC PANEL WITH GFR
ALT: 9 U/L (ref 6–29)
AST: 20 U/L (ref 10–35)
Albumin: 3.2 g/dL — ABNORMAL LOW (ref 3.6–5.1)
Alkaline Phosphatase: 44 U/L (ref 33–130)
BUN: 20 mg/dL (ref 7–25)
CALCIUM: 9.2 mg/dL (ref 8.6–10.4)
CHLORIDE: 107 mmol/L (ref 98–110)
CO2: 27 mmol/L (ref 20–31)
CREATININE: 0.76 mg/dL (ref 0.50–1.05)
GFR, Est African American: >89 mL/min (ref >=60)
GFR, Est Non African American: 86 mL/min (ref >=60)
GLUCOSE: 59 mg/dL — AB (ref 65–99)
POTASSIUM: 4.5 mmol/L (ref 3.5–5.3)
SODIUM: 142 mmol/L (ref 135–146)
Total Bilirubin: 0.4 mg/dL (ref 0.2–1.2)
Total Protein: 5.4 g/dL — ABNORMAL LOW (ref 6.1–8.1)

## 2016-03-16 MED ORDER — LISINOPRIL-HYDROCHLOROTHIAZIDE 20-12.5 MG PO TABS: 1.0000 | ORAL_TABLET | Freq: Every day | ORAL | 3 refills | Status: DC

## 2016-03-16 NOTE — Assessment & Plan Note (Signed)
Long history of not wanting to treat blood pressure with medication. We discussed today at length spinning greater than 50% of our 25 minute office visit in counseling education regarding risks and benefits of untreated blood pressure elevation. We'll try her on a combination ACE inhibitor and mild diuretic. I'll see her in 4 weeks.

## 2016-03-16 NOTE — Progress Notes (Signed)
    CHIEF COMPLAINT / HPI:   Has been having some headaches. Start checking her blood pressure relies as it is elevated more days than not. Usually in the high 140s or low 150s over 90s. Has not had chest pain. No shortness of breath.  REVIEW OF SYSTEMS:  See history of present illness  OBJECTIVE:  Vital signs are reviewed.   Vital signs reviewed. GENERAL: Well-developed, well-nourished, no acute distress. CARDIOVASCULAR: Regular rate and rhythm no murmur gallop or rub LUNGS: Clear to auscultation bilaterally, no rales or wheeze. ABDOMEN: Soft positive bowel sounds NEURO: No gross focal neurological deficits. MSK: Movement of extremity x 4.    ASSESSMENT / PLAN: Please see problem oriented charting for details

## 2016-03-16 NOTE — Patient Instructions (Signed)
I have called in a blood pressure pill--itis lisinopril / HCTZ. It has some mild diuretic effects---this usually improves in first few days---if it is a  problem CALL ME and leave me a message.   It is GREAT to see you! Keep taking your blood pressure 2 or 3 times a week and bring those ith you See me in about a month

## 2016-03-17 ENCOUNTER — Encounter: Payer: Self-pay | Admitting: Family Medicine

## 2016-05-04 ENCOUNTER — Encounter: Payer: Self-pay | Admitting: Family Medicine

## 2016-05-04 ENCOUNTER — Ambulatory Visit (INDEPENDENT_AMBULATORY_CARE_PROVIDER_SITE_OTHER): Payer: Managed Care, Other (non HMO) | Admitting: Family Medicine

## 2016-05-04 ENCOUNTER — Other Ambulatory Visit: Payer: Self-pay | Admitting: Family Medicine

## 2016-05-04 VITALS — BP 132/88 | HR 63 | Temp 98.0°F | Ht 64.0 in | Wt 168.8 lb

## 2016-05-04 DIAGNOSIS — M5416 Radiculopathy, lumbar region: Secondary | ICD-10-CM

## 2016-05-04 DIAGNOSIS — I10 Essential (primary) hypertension: Secondary | ICD-10-CM

## 2016-05-04 DIAGNOSIS — M5410 Radiculopathy, site unspecified: Secondary | ICD-10-CM

## 2016-05-04 DIAGNOSIS — R200 Anesthesia of skin: Secondary | ICD-10-CM | POA: Diagnosis not present

## 2016-05-04 LAB — BASIC METABOLIC PANEL
BUN: 20 mg/dL (ref 7–25)
CALCIUM: 8.7 mg/dL (ref 8.6–10.4)
CHLORIDE: 105 mmol/L (ref 98–110)
CO2: 31 mmol/L (ref 20–31)
CREATININE: 0.76 mg/dL (ref 0.50–1.05)
Glucose, Bld: 101 mg/dL — ABNORMAL HIGH (ref 65–99)
Potassium: 4.4 mmol/L (ref 3.5–5.3)
Sodium: 141 mmol/L (ref 135–146)

## 2016-05-04 MED ORDER — LISINOPRIL-HYDROCHLOROTHIAZIDE 20-12.5 MG PO TABS: 2.0000 | ORAL_TABLET | Freq: Every day | ORAL | 3 refills | Status: DC

## 2016-05-04 MED ORDER — DIAZEPAM 5 MG PO TABS: ORAL_TABLET | ORAL | 0 refills | Status: DC

## 2016-05-04 NOTE — Assessment & Plan Note (Signed)
With got improved control but I will increase her to 2 tabs lisinopril/HCTZ daily. Recheck potassium and kidney function today. Follow-up 4-8 weeks with blood pressure. I like her to continue check blood pressure readings.

## 2016-05-04 NOTE — Patient Instructions (Signed)
I am increasing your BP medicine dose --to get the dose I want I will have you take 2 tabs a day instead of one. You should not notice any new symptoms but if you do call me and let me know I am checking your kidny function today--we will check it once or twice a year. I will send you a letter with results.  We have set up an MRI of your back I should call you within 48 hours (working days) of having it done. If I have not, call the office and leave me a message I do not expect to see anything more than some degenerative changes and a few mall bulging discs. I suspect the back pain and thigh numbness will go away on their own but I want to make sure we are not missing something else.   I have give you a rx for some diazepam to take before the MRI. Make sure you have someone drive you while you are taking this.  If it gets worse or you have some new symptoms like leg weakness or falls call and let me know Let me see you back in about 2 months to recheck everything Great to see you!

## 2016-05-04 NOTE — Assessment & Plan Note (Signed)
Very consistent with herniated disc or foraminal stenosis. Trace significant numbness and acute onset. Would like to get MRI. We'll set that up. She'll let me know if anything changes or gets worse and I will contact her after the MRIs completed.

## 2016-05-04 NOTE — Progress Notes (Signed)
    CHIEF COMPLAINT / HPI:   #1. Follow-up hypertension. Tolerating the medicine well. Had some dizziness in the first few days at that has resolved. She's been checking her blood pressures and brings a diary. Several of the diastolics are in the high 80s to low 90s a couple of them were in the low 100s. Her systolic has been well controlled #2. Acute onset last week of some right lower back pain was quite severe, she almost went to the emergency department. She'll use some heat and some ibuprofen in a back brace and it improved. One day after the back pain started she noticed some numbness in her anterior right thigh occasional shooting tingling sensation in the third fourth and fifth toe on her right foot. The paresthesias in the toe have resolved. The numbness in the thyroid remains. She's had no leg weakness. No falls. No bowel or bladder incontinence.  REVIEW OF SYSTEMS:  No unusual weight change, no chest pain, no shortness of breath.  OBJECTIVE:  Vital signs are reviewed.   GEN.: Well-developed female no acute distress BACK: No defect noted. Mildly tender to palpation over the PSIS but no paravertebral muscle tenderness. She can flex at the hips 90 and hyperextend without pain lateral rotation is normal. Straight leg raise is negative bilaterally in seated and supine position. Extremity: Lower extremity strength 5 out of 5 hips, knees, ankle. NEURO: DTRs 2-3+ bilaterally symmetrical knees and ankles. Intact sensation soft touch  right lower extremity compared with left. CV: Regular rate and rhythm. No edema.  ASSESSMENT / PLAN: Please see problem oriented charting for details

## 2016-05-05 ENCOUNTER — Encounter: Payer: Self-pay | Admitting: Family Medicine

## 2016-05-09 ENCOUNTER — Telehealth: Payer: Self-pay | Admitting: Family Medicine

## 2016-05-09 ENCOUNTER — Other Ambulatory Visit: Payer: Self-pay | Admitting: Family Medicine

## 2016-05-09 NOTE — Telephone Encounter (Signed)
Informed pt that it had been authorized and that I contacted GI and gavge them the Auth#. Katharina Caper, Emylie Amster D, [place]

## 2016-05-09 NOTE — Telephone Encounter (Signed)
RI is scheduled for Wed March 22 at 5:20. Pt was told by her insurance company, [employer], it must be pre certified. Pt wants to verify this has been done. Please call and let her know.

## 2016-05-11 ENCOUNTER — Inpatient Hospital Stay: Admission: RE | Admit: 2016-05-11 | Payer: 59 | Source: Ambulatory Visit

## 2016-05-12 ENCOUNTER — Ambulatory Visit
Admission: RE | Admit: 2016-05-12 | Discharge: 2016-05-12 | Disposition: A | Discharge status: Home / Self Care | Payer: 59 | Source: Ambulatory Visit | Attending: Family Medicine | Admitting: Family Medicine

## 2016-05-12 ENCOUNTER — Telehealth: Payer: Self-pay | Admitting: Family Medicine

## 2016-05-12 DIAGNOSIS — M5416 Radiculopathy, lumbar region: Secondary | ICD-10-CM

## 2016-05-12 DIAGNOSIS — R200 Anesthesia of skin: Secondary | ICD-10-CM

## 2016-05-12 NOTE — Telephone Encounter (Signed)
FMLA form dropped off for at front desk for completion.  Verified that patient section of form has been completed.  Last DOS/WCC with PCP was 05/04/16.  Placed form in white team folder to be completed by clinical staff.  Carmina Miller

## 2016-05-16 NOTE — Telephone Encounter (Signed)
Clinical info completed on FMLA form.  Place form in Dr. Verlon Au box for completion.  Paula Aguilar, Paula Aguilar D, [place]

## 2016-05-19 ENCOUNTER — Telehealth: Payer: Self-pay | Admitting: Family Medicine

## 2016-05-19 DIAGNOSIS — M5416 Radiculopathy, lumbar region: Secondary | ICD-10-CM

## 2016-05-19 NOTE — Telephone Encounter (Signed)
Pt wants someone to call her with MRI results. ep

## 2016-05-19 NOTE — Telephone Encounter (Signed)
Pt states she has to have these forms turned in by the end of next week. Please call pt when they are ready to be picked up. ep

## 2016-05-23 NOTE — Telephone Encounter (Signed)
[phone] (H) Phone call discussion re results and plan  I rec [facility] evaluation as she continues to have leg heaviness and radicular pain. The low back pain itself is improved, but the other is not. She agrees to evaluation. They may receomend ESi or conservative management but wit  Continued leg heaviness I would like their opinion. [name]

## 2016-05-23 NOTE — Telephone Encounter (Signed)
Patient informed that FMLA forms were completed and ready for pickup.  Forms copied for scanning in patient's record.  Derl Barrow, RN

## 2016-06-14 ENCOUNTER — Telehealth: Payer: Self-pay | Admitting: Family Medicine

## 2016-06-14 NOTE — Telephone Encounter (Signed)
FMLA form dropped off for at front desk for revision/completion.  Verified that patient section of form has been completed.  Last DOS/WCC with PCP was 05/04/16  Placed form in team folder to be completed by clinical staff.  Crista Luria

## 2016-06-14 NOTE — Telephone Encounter (Signed)
Form placed back in PCP box for revisions to be made per pt request in previous note. Katharina Caper, Susa Bones D, [place]

## 2016-06-20 NOTE — Telephone Encounter (Signed)
Done Given to [facility]

## 2016-06-21 NOTE — Telephone Encounter (Signed)
Pt informed that forms are complete and ready for pickup.  Forms copied for scanning in patient's record.  Derl Barrow, RN

## 2016-08-21 ENCOUNTER — Other Ambulatory Visit: Payer: Self-pay | Admitting: Family Medicine

## 2016-09-22 ENCOUNTER — Other Ambulatory Visit: Payer: Self-pay | Admitting: Family Medicine

## 2016-10-05 ENCOUNTER — Telehealth: Payer: Self-pay | Admitting: Family Medicine

## 2016-10-05 ENCOUNTER — Ambulatory Visit: Payer: 59 | Admitting: Family Medicine

## 2016-10-05 ENCOUNTER — Other Ambulatory Visit: Payer: Self-pay | Admitting: *Deleted

## 2016-10-05 NOTE — Telephone Encounter (Signed)
Pt apologizes for missing her appt today, states that she got tied up at the [facility]ist.   She is requesting a refill of her albuterol sent to [facility] so she can pick it up today and not wait for [facility] to deliver.  Forwarded to MD.  Clinton Sawyer, Salome Spotted, [place]

## 2016-10-07 MED ORDER — ALBUTEROL SULFATE HFA 108 (90 BASE) MCG/ACT IN AERS: 2.0000 | INHALATION_SPRAY | Freq: Two times a day (BID) | RESPIRATORY_TRACT | 0 refills | Status: DC | PRN

## 2016-10-19 ENCOUNTER — Ambulatory Visit (INDEPENDENT_AMBULATORY_CARE_PROVIDER_SITE_OTHER): Payer: 59 | Admitting: Family Medicine

## 2016-10-19 ENCOUNTER — Encounter: Payer: Self-pay | Admitting: Family Medicine

## 2016-10-19 VITALS — BP 118/88 | HR 79 | Temp 97.7°F | Ht 64.0 in | Wt 180.4 lb

## 2016-10-19 DIAGNOSIS — J301 Allergic rhinitis due to pollen: Secondary | ICD-10-CM | POA: Diagnosis not present

## 2016-10-19 DIAGNOSIS — M25551 Pain in right hip: Secondary | ICD-10-CM | POA: Diagnosis not present

## 2016-10-19 DIAGNOSIS — K219 Gastro-esophageal reflux disease without esophagitis: Secondary | ICD-10-CM

## 2016-10-19 DIAGNOSIS — M25552 Pain in left hip: Secondary | ICD-10-CM | POA: Diagnosis not present

## 2016-10-19 DIAGNOSIS — J45909 Unspecified asthma, uncomplicated: Secondary | ICD-10-CM

## 2016-10-19 DIAGNOSIS — Z23 Encounter for immunization: Secondary | ICD-10-CM | POA: Diagnosis not present

## 2016-10-19 MED ORDER — METHYLPREDNISOLONE ACETATE 40 MG/ML IJ SUSP
40.0000 mg | Freq: Once | INTRAMUSCULAR | Status: AC
  Administered 2016-10-19: 40 mg via INTRAMUSCULAR

## 2016-10-19 MED ORDER — FLUTICASONE-SALMETEROL 250-50 MCG/DOSE IN AEPB: 1.0000 | INHALATION_SPRAY | Freq: Two times a day (BID) | RESPIRATORY_TRACT | 3 refills | Status: DC

## 2016-10-19 MED ORDER — MELOXICAM 15 MG PO TABS: 15.0000 mg | ORAL_TABLET | Freq: Every day | ORAL | 3 refills | Status: DC

## 2016-10-20 NOTE — Assessment & Plan Note (Signed)
Refilled meds

## 2016-10-20 NOTE — Assessment & Plan Note (Signed)
Continue current meds 

## 2016-10-20 NOTE — Progress Notes (Signed)
    CHIEF COMPLAINT / HPI:  Bilateral hip pain---bothers her most at night when she lies on her side. R>L but both are bothering her. Going on several weeks. OTC not helping. Rest not helping.  Allergies problematic even though she is using meds regularly. Has questions abut other options.  Back pain is better. She saw the back surgeon but did not like his attitude. She bought a back support and  That seems to be helping. Wants to do nothing more at this time.  GERD stable. Needs refill on asthma meds as humid hot weather causing issues. When she goes to beach on weekend, she has no problems.  REVIEW OF SYSTEMS:  Denies chest pain, no SOB with exertion although she is haviing asthma issues in humid weather.  OBJECTIVE:   Vital signs reviewed. GENERAL: Well-developed, well-nourished, no acute distress. HEENT: TM retratced . OP without exudate. Neck w/o LAD. No TM, no carotid bruits. CARDIOVASCULAR: Regular rate and rhythm no murmur gallop or rub LUNGS: Clear to auscultation bilaterally, no rales or wheeze. ABDOMEN: Soft positive bowel sounds NEURO: No gross focal neurological deficits. MSK: Movement of extremity x 4. TTP Bilateral lateral huip over greater trochanter area and palpation here reproduces her pain. Hip IR/ER full and painless. LE strength 5/5 hip/knee/ankle. GAIT is normal.   INJECTION: Patient was given informed consent, signed copy in the chart. Appropriate time out was taken. Area prepped and draped in usual sterile fashion. 1 cc of methylprednisolone 40 mg/ml plus  4 cc of 1% lidocaine without epinephrine was injected into the bilateral hips greater trochanteric bursausing a(n) perpendicular approach. The patient tolerated the procedure well. There were no complications. Post procedure instructions were given.    ASSESSMENT / PLAN: Please see problem oriented charting for details

## 2016-10-20 NOTE — Patient Instructions (Signed)
Today you received an injection with corticosteroid. This injection is usually done in response to pain and inflammation. There is some "numbing" medicine also in the shot so the injected area may be numb and feel really good for the next couple of hours. The numbing medicine usually wears off in 2-3 hours though, and then your pain level will be right back where it was before the injection.   The actually benefit from the steroid injection is usually noticed in 2-7 days. You may actually experience a small (as in 10%) INCREASE in pain in the first 24 hours---that is common.   Things to watch out for that you should contact us or a health care provider urgently would include: 1. Unusual (as in more than 10%) increase in pain 2. New fever > 101.5 3. New swelling or redness of the injected area.  4. Streaking of red lines around the area injected.  

## 2016-11-28 ENCOUNTER — Other Ambulatory Visit: Payer: Self-pay | Admitting: Family Medicine

## 2016-12-04 ENCOUNTER — Other Ambulatory Visit: Payer: Self-pay | Admitting: Family Medicine

## 2016-12-28 ENCOUNTER — Other Ambulatory Visit: Payer: Self-pay

## 2016-12-28 ENCOUNTER — Encounter: Payer: Self-pay | Admitting: Family Medicine

## 2016-12-28 ENCOUNTER — Ambulatory Visit (INDEPENDENT_AMBULATORY_CARE_PROVIDER_SITE_OTHER): Payer: 59 | Admitting: Family Medicine

## 2016-12-28 VITALS — BP 112/82 | HR 73 | Temp 97.9°F | Ht 64.0 in | Wt 166.0 lb

## 2016-12-28 DIAGNOSIS — M722 Plantar fascial fibromatosis: Secondary | ICD-10-CM

## 2016-12-28 MED ORDER — METHYLPREDNISOLONE ACETATE 40 MG/ML IJ SUSP
40.0000 mg | Freq: Once | INTRAMUSCULAR | Status: AC
  Administered 2016-12-28: 40 mg via INTRAMUSCULAR

## 2016-12-28 MED ORDER — MONTELUKAST SODIUM 10 MG PO TABS: 10.0000 mg | ORAL_TABLET | Freq: Every day | ORAL | 3 refills | Status: DC

## 2016-12-28 NOTE — Patient Instructions (Signed)
Plantar Fasciitis  Plantar fasciitis is a painful foot condition that affects the heel. It occurs when the band of tissue that connects the toes to the heel bone (plantar fascia) becomes irritated. This can happen after exercising too much or doing other repetitive activities (overuse injury). The pain from plantar fasciitis can range from mild irritation to severe pain that makes it difficult for you to walk or move. The pain is usually worse in the morning or after you have been sitting or lying down for a while. What are the causes? This condition may be caused by:  Standing for long periods of time.  Wearing shoes that do not fit.  Doing high-impact activities, including running, aerobics, and ballet.  Being overweight.  Having an abnormal way of walking (gait).  Having tight calf muscles.  Having high arches in your feet.  Starting a new athletic activity.  What are the signs or symptoms? The main symptom of this condition is heel pain. Other symptoms include:  Pain that gets worse after activity or exercise.  Pain that is worse in the morning or after resting.  Pain that goes away after you walk for a few minutes.  How is this diagnosed? This condition may be diagnosed based on your signs and symptoms. Your health care provider will also do a physical exam to check for:  A tender area on the bottom of your foot.  A high arch in your foot.  Pain when you move your foot.  Difficulty moving your foot.   Pain wit the first step[ of the morning  How is this treated? Treatment for plantar fasciitis depends on the severity of the condition. Your treatment may include:  Rest, ice, and over-the-counter pain medicines to manage your pain.  elieve symptoms and prevent problems in the future.  Cortisone injections to relieve severe pain.  Nitrolyceron therapy  Follow these instructions at home:  Roll your foot over  a bottle of frozen water. Do this for5- 10  minutes, 2times a day. You can also do extra sessions if you are having pain as it will help relieve [ain.  Wear supportive lace up shoes..  Try wearing athletic shoes with air-sole or gel-sole cushions or soft shoe inserts.(arch supports--Dr Scoll's are good but there are others available) we can also make some custom fitted ones if we need to.    Keep all follow-up appointments with your health care provider

## 2016-12-30 NOTE — Progress Notes (Signed)
    CHIEF COMPLAINT / HPI:  Left foot pain. This is been going about a month. Pain worse with first step in the morning. She's had no change in her activities, no change in her foot wear. Recall she may have had something similar to this many years ago. Never had a foot surgery.  REVIEW OF SYSTEMS:  Denies foot numbness. No rash. No unusual weight change.  PERTINENT  PMH / PSH: I have reviewed the patient's medications, allergies, past medical and surgical history, smoking status and updated in the EMR as appropriate.   OBJECTIVE:     GEN.: Well-developed female no acute distress ANKLES: Full range of motion. Achilles tendons are nontender to palpation FEET: Slight loss of transverse arch and longitudinal arch bilaterally. Left plantar fasciitis tender to palpation at the origin. VASCULAR: Dorsalis pedis pulses 2+ bilaterally symmetrical.  PROCEDURE: INJECTION: Patient was given informed consent, signed copy in the chart. Appropriate time out was taken. Area prepped and draped in usual sterile fashion. Ethyl chloride was  used for local anesthesia. A 21 gauge 1 1/2 inch needle was used.. 1 cc of methylprednisolone 40 mg/ml plus  1 cc of 1% lidocaine without epinephrine was injected into the area above the origin of the left plantar fascial using a(n) medial approach.   The patient tolerated the procedure well. There were no complications. Post procedure instructions were given.   ASSESSMENT / PLAN: Plan fasciitis. This sounds acute. We'll try corticosteroid injection and she doesn't get better then we'll consider custom molded orthotics or nitroglycerin patch therapy.

## 2017-04-04 ENCOUNTER — Ambulatory Visit (INDEPENDENT_AMBULATORY_CARE_PROVIDER_SITE_OTHER): Payer: 59 | Admitting: Student

## 2017-04-04 ENCOUNTER — Other Ambulatory Visit: Payer: Self-pay

## 2017-04-04 ENCOUNTER — Encounter: Payer: Self-pay | Admitting: Student

## 2017-04-04 VITALS — BP 108/68 | HR 90 | Temp 97.9°F | Ht 64.0 in | Wt 156.2 lb

## 2017-04-04 DIAGNOSIS — R1030 Lower abdominal pain, unspecified: Secondary | ICD-10-CM | POA: Diagnosis not present

## 2017-04-04 DIAGNOSIS — K5900 Constipation, unspecified: Secondary | ICD-10-CM | POA: Diagnosis not present

## 2017-04-04 MED ORDER — POLYETHYLENE GLYCOL 3350 17 GM/SCOOP PO POWD: 17.0000 g | Freq: Every day | ORAL | 0 refills | Status: DC

## 2017-04-04 NOTE — Patient Instructions (Signed)
It was great seeing you today! We have addressed the following issues today  Abdominal pain: This could be likely due to constipation.  It could also be due to your recent viral infection.  We sent a prescription for MiraLAX that you could try for constipation.  You can take 1-3 times a day.  If no improvement with this or if you have other symptoms concerning to you, please come back and see [place].   If we did any lab work today, and the results require attention, either me or my nurse will get in touch with you. If everything is normal, you will get a letter in mail and a message via . If you don't hear from [place] in two weeks, please give [place] a call. Otherwise, we look forward to seeing you again at your next visit. If you have any questions or concerns before then, please call the clinic at [phone].  Please bring all your medications to every doctors visit  Sign up for My Chart to have easy access to your labs results, and communication with your Primary care physician.    Please check-out at the front desk before leaving the clinic.    Take Care,   Dr. Cyndia Skeeters

## 2017-04-04 NOTE — Progress Notes (Signed)
Subjective:    Paula Aguilar is a 62 y.o. old female here for abdominal pain  HPI Low abdominal pain: this has been going on for 10 days. Started with loose stool and cold symptoms.  Diarrhea is watery. Nausea for two days but no emesis. Denies hematochezia or melena. Pain vague and reports urge to use bathroom for bowel movement.  Pain is constant. Worse with movement. Pain on opposite side when she lies on her side. Worse after eating.  Improves with lying down. No urinary symptoms. Denies vaginal discharge or bleeding.  Denies fevers. Denies new food or recent travel. 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  Denies stressors at home or work.  Started taking fish oil recently.  No history of abdominal surgery.   Denies smoking cigarettes, drinking alcohol or recreational drug use. PMH/Problem List: has GLOBUS HYSTERICUS; ADJ DISORDER WITH MIXED ANXIETY & DEPRESSED MOOD; Other specified visual disturbances; Allergic rhinitis; Asthma; KNEE PAIN, RIGHT, CHRONIC; INSOMNIA; Edema; SNORING; Right shoulder pain; Well adult exam; H/O breast implant; GERD (gastroesophageal reflux disease); At risk for ineffective health maintenance; Elevated LDL cholesterol level; Essential hypertension; and Acute radicular low back pain on their problem list.   has a past medical history of Arthritis, Asthma, Hypertension, Seasonal allergies, and Sinusitis.  FH:  No family history on file.  SH Social History   Tobacco Use  . Smoking status: Never Smoker  . Smokeless tobacco: Never Used  Substance Use Topics  . Alcohol use: No  . Drug use: No    Review of Systems Review of systems negative except for pertinent positives and negatives in history of present illness above.     Objective:     Vitals:   04/04/17 1437  BP: 108/68  Pulse: 90  Temp: 97.9 F (36.6 C)  TempSrc: Oral  SpO2: 99%  Weight: 156 lb 3.2 oz (70.9 kg)  Height: 5\' 4"  (1.626 m)   Body mass index is 26.81 kg/m.  Physical Exam  GEN: appears  well, no apparent distress. Eyes: conjunctiva without injection, sclera anicteric CVS: RRR, nl s1 & s2, no murmurs, no edema RESP: no IWOB, good air movement bilaterally, CTAB GI: BS present & normal.  Diffuse tenderness to palpation over lower abdomen bilaterally.  No rebound, guarding or palpable mass.  Negative carnett's sign.  Negative Murphy sign. GU: no CVA tenderness SKIN: no apparent skin lesion NEURO: alert and oiented appropriately, no gross deficits  PSYCH: euthymic mood with congruent affect    Assessment and Plan:  1. Lower abdominal pain: Likely due to constipation.  It could also be post viral given history of recent viral URI.  Exam benign except for diffuse tenderness to palpation over lower abdomen.  She has no rebound.  She has no urinary symptoms nor vaginal discharge or bleeding.  Low suspicion for infectious etiology without fever.  No history of abdominal surgery.  Can think of IBS given history of depression and anxiety but the acute nature of her pain argues against this.  Will try MiraLAX.  Patient has a follow-up with PCP in a week.  She can discuss about further evaluation if no improvement with this.  She also need screening colonoscopy.  2. Constipation, unspecified constipation type - polyethylene glycol powder (GLYCOLAX/MIRALAX) powder; Take 17 g by mouth daily. May take up to three times a day if no improvement.  Dispense: 527 g; Refill: 0  Return if symptoms worsen or fail to improve.  Mercy Riding, MD 04/04/17 Pager: [phone]

## 2017-04-12 ENCOUNTER — Ambulatory Visit
Admission: RE | Admit: 2017-04-12 | Discharge: 2017-04-12 | Disposition: A | Discharge status: Home / Self Care | Payer: 59 | Source: Ambulatory Visit | Attending: Family Medicine | Admitting: Family Medicine

## 2017-04-12 ENCOUNTER — Ambulatory Visit (INDEPENDENT_AMBULATORY_CARE_PROVIDER_SITE_OTHER): Payer: 59 | Admitting: Family Medicine

## 2017-04-12 ENCOUNTER — Encounter: Payer: Self-pay | Admitting: Family Medicine

## 2017-04-12 ENCOUNTER — Other Ambulatory Visit: Payer: Self-pay

## 2017-04-12 VITALS — BP 104/78 | HR 82 | Temp 97.7°F | Ht 64.0 in | Wt 154.4 lb

## 2017-04-12 DIAGNOSIS — M19041 Primary osteoarthritis, right hand: Secondary | ICD-10-CM

## 2017-04-12 DIAGNOSIS — M722 Plantar fascial fibromatosis: Secondary | ICD-10-CM

## 2017-04-12 DIAGNOSIS — R1030 Lower abdominal pain, unspecified: Secondary | ICD-10-CM

## 2017-04-12 MED ORDER — MOMETASONE FUROATE 50 MCG/ACT NA SUSP: NASAL | 12 refills | Status: DC

## 2017-04-12 MED ORDER — METHYLPREDNISOLONE ACETATE 40 MG/ML IJ SUSP
40.0000 mg | Freq: Once | INTRAMUSCULAR | Status: AC
  Administered 2017-04-12: 40 mg via INTRAMUSCULAR

## 2017-04-12 NOTE — Patient Instructions (Signed)
I gave you an injection for the plantar fasciitis today.  Do daily ice rolls on your foot for about 10 minutes each night.  I will see you in about a month.  They should get gradually better over that time. I want you to  stop and get an x-ray of your stomach and an x-ray of your hand.  I suspect you have retained stool in your stomach due to decreased motility.  This is common as we get older and can occur after a viral illness.  I want you to start taking that over-the-counter medicine that you got at last office visit daily.  Take half the dose each day.  You can mix it in orange juice or whatever you want.  X-ray of the remaining half of the next day in the refrigerator.  I think the x-ray of your hand is going to show arthritis.  If it is that, then we can potentially inject it next time when I see you back.  Please let me see you in 3-4 weeks.  I have also sent in some Nasonex.  Use that 1 spray each nostril every day.  I think that will help your ear pain.

## 2017-04-14 DIAGNOSIS — M19041 Primary osteoarthritis, right hand: Secondary | ICD-10-CM | POA: Insufficient documentation

## 2017-04-14 DIAGNOSIS — M722 Plantar fascial fibromatosis: Secondary | ICD-10-CM | POA: Insufficient documentation

## 2017-04-14 DIAGNOSIS — R1030 Lower abdominal pain, unspecified: Secondary | ICD-10-CM | POA: Insufficient documentation

## 2017-04-14 NOTE — Progress Notes (Signed)
    CHIEF COMPLAINT / HPI:  generalized abdominal pain, crampy. Was seen here and started prn miralax. Helps her have BM but if she does not use it she may not have BM daily. Not taking med daily. No blood in stool. No change in stool caliber.  F/u left plantar fasciitis. Was some better but now starting to worsen. Wants to consider CSI.  Pain in hand. Index finger knuckle pain. Aching.  REVIEW OF SYSTEMS: No fever. See HPI  PERTINENT  PMH / PSH: I have reviewed the patient's medications, allergies, past medical and surgical history, smoking status and updated in the EMR as appropriate.   OBJECTIVE:  Vital signs reviewed. GENERAL: Well-developed, well-nourished, no acute distress. CARDIOVASCULAR: Regular rate and rhythm no murmur gallop or rub LUNGS: Clear to auscultation bilaterally, no rales or wheeze. ABDOMEN: Soft positive bowel sounds NEURO: No gross focal neurological deficits. MSK: Movement of extremity x 4. Mild deformity index fingers DIP and MCP.  FROM still all fingers.    PROCEDURE: INJECTION: Patient was given informed consent, signed copy in the chart. Appropriate time out was taken. Area prepped and draped in usual sterile fashion. Ethyl chloride was  used for local anesthesia. A 21 gauge 1 1/2 inch needle was used.. 1 cc of methylprednisolone 40 mg/ml plus  1 cc of 1% lidocaine without epinephrine was injected into the area above proximal plantar fascia using a(n)lateral approach.   The patient tolerated the procedure well. There were no complications. Post procedure instructions were given.   ASSESSMENT / PLAN: Please see problem oriented charting for details

## 2017-04-14 NOTE — Assessment & Plan Note (Signed)
Suspect constipation. Will increase miralax to daily, not prn. Flat plat x ray. F/u 1 month

## 2017-04-14 NOTE — Assessment & Plan Note (Signed)
CSI today

## 2017-04-14 NOTE — Assessment & Plan Note (Signed)
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 Will get x ray. See her back after x ray and consider CSI

## 2017-04-19 ENCOUNTER — Telehealth: Payer: Self-pay | Admitting: Family Medicine

## 2017-04-19 NOTE — Telephone Encounter (Signed)
Pt would like to know her results from her x-ray. Please call her back to discuss them.

## 2017-04-21 NOTE — Telephone Encounter (Signed)
Left mssg Thumb arthritis and constipation. Plan unchanged. [name]

## 2017-05-01 ENCOUNTER — Other Ambulatory Visit: Payer: Self-pay | Admitting: Family Medicine

## 2017-05-01 DIAGNOSIS — I10 Essential (primary) hypertension: Secondary | ICD-10-CM

## 2017-05-10 ENCOUNTER — Encounter: Payer: Self-pay | Admitting: Family Medicine

## 2017-05-10 ENCOUNTER — Ambulatory Visit (INDEPENDENT_AMBULATORY_CARE_PROVIDER_SITE_OTHER): Payer: PRIVATE HEALTH INSURANCE | Admitting: Family Medicine

## 2017-05-10 ENCOUNTER — Other Ambulatory Visit: Payer: Self-pay

## 2017-05-10 VITALS — BP 102/64 | HR 63 | Temp 97.9°F | Ht 64.0 in | Wt 150.0 lb

## 2017-05-10 DIAGNOSIS — R1084 Generalized abdominal pain: Secondary | ICD-10-CM | POA: Insufficient documentation

## 2017-05-10 DIAGNOSIS — M19041 Primary osteoarthritis, right hand: Secondary | ICD-10-CM

## 2017-05-10 DIAGNOSIS — M722 Plantar fascial fibromatosis: Secondary | ICD-10-CM

## 2017-05-10 DIAGNOSIS — L814 Other melanin hyperpigmentation: Secondary | ICD-10-CM | POA: Diagnosis not present

## 2017-05-10 MED ORDER — METHYLPREDNISOLONE ACETATE 40 MG/ML IJ SUSP
40.0000 mg | Freq: Once | INTRAMUSCULAR | Status: AC
  Administered 2017-05-10: 40 mg via INTRAMUSCULAR

## 2017-05-10 NOTE — Progress Notes (Signed)
    CHIEF COMPLAINT / HPI: #1.  Follow-up left plantar fasciitis: Much improved after injection. 2.  Right index finger knuckle still hurting and she would like to consider injection.  Has some pain in her right thumb area but not as bad as the right index knuckle.  Pain is aching, 8-9 out of 10.  Constant.  Nothing seems to make it better.  Activity makes it worse. 3.  Abdominal pain continues to be problematic.  She is moving her bowels more regularly but still has quite a bit of gas.  Has pain off and on all day.  Worse with eating.  Having regular bowel movements with normal stool caliber and no blood, lots of gas and lots of cramping even if she is not eaten in quite a while.  Typically eats a small breakfast and lunch and larger dinner.  After dinner, cramping is so severe she must lie down for a while on occasion. #4.  New spot on her face is getting a little darker.  She thinks the spots been there for a while but really did not notice that much.  REVIEW OF SYSTEMS: 30 pound weight loss since end of August, unintentional.  No fevers.  No change in appetite.  She is having abdominal pain as per HPI.  Energy level is stable.  Sleep is stable.  Mood is good.  Arthralgia in the hand joints as per HPI.  PERTINENT  PMH / PSH: I have reviewed the patient's medications, allergies, past medical and surgical history, smoking status and updated in the EMR as appropriate.  She thinks she had a colonoscopy at age 2 or 24 with no sign of polyps; cannot remember who did it. Never smoker  OBJECTIVE: Vital signs reviewed. GENERAL: Well-developed, well-nourished, no acute distress. CARDIOVASCULAR: Regular rate and rhythm no murmur gallop or rub LUNGS: Clear to auscultation bilaterally, no rales or wheeze. ABDOMEN: Soft positive bowel sounds NEURO: No gross focal neurological deficits. MSK: Movement of extremity x 4.  Right index MCP tender to palpation, slight deformity.  She also has some deformities  of the DIP and PIP joints of both hands. Skin: On the right cheek there is a 4 mm x 7 mm slightly irregular flat macule that is hyperpigmented.  Monochromatic.  IMAGING: Acute abdominal series obtained at last office visit reviewed including images today.  She has a lot of chronic stool retention. Right hand x-ray shows some mild to moderate degenerative changes right hand joints.  PROCEDURE: INJECTION: Patient was given informed consent, signed copy in the chart. Appropriate time out was taken. Area prepped and draped in usual sterile fashion. Ethyl chloride was  used for local anesthesia. A 21 gauge 1 1/2 inch needle was used.. 1 cc of methylprednisolone 40 mg/ml plus 1 cc of 1% lidocaine without epinephrine was injected into the right index MCP using a(n) medial approach.   The patient tolerated the procedure well. There were no complications. Post procedure instructions were given.     ASSESSMENT / PLAN: Please see problem oriented charting for details

## 2017-05-10 NOTE — Assessment & Plan Note (Signed)
Significant improvement after conservative therapy with stretches and icing in addition of corticosteroid injection at last office visit.  Should she have recurrence, I think I would proceed with custom molded orthotics before we consider another injection.

## 2017-05-10 NOTE — Assessment & Plan Note (Signed)
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  I think she is mostly suffering from the pain of synovitis.  She does have some deformity of other DIP/PIP joints.  We discussed at length.  We will try corticosteroid injection today and she will follow-up if not improved.

## 2017-05-10 NOTE — Assessment & Plan Note (Signed)
Her symptoms sounded consistent with chronic constipation which was supported by acute abdominal series showing lots of stool throughout the colon.  She did not respond with significant improvement to pretty good bowel regimen.  She is reporting a lot of cramping particularly with eating her largest meal of the day.  She has been on chronic omeprazole for reflux.  I think at this point I would like gastroenterology to at least evaluate her.  Notably is almost time for her repeat colonoscopy.  She had one done at age 54 or 42 but cannot remember who did it and I do not have a report in our current EMR.

## 2017-05-24 ENCOUNTER — Encounter: Payer: Self-pay | Admitting: Physician Assistant

## 2017-05-24 ENCOUNTER — Telehealth: Payer: Self-pay | Admitting: Family Medicine

## 2017-05-24 NOTE — Telephone Encounter (Signed)
Pt wants to speak to someone about their opinion one seeing a physicians assistant rather than the doctor she was referred. Please contact pt.

## 2017-05-25 NOTE — Telephone Encounter (Signed)
Spoke with Dr. Nori Riis in reference to below and she said it would be fine for pt to see PA.  Informed pt of this.  Pt was very thankful. Katharina Caper, April D, [place]

## 2017-06-07 ENCOUNTER — Ambulatory Visit: Payer: PRIVATE HEALTH INSURANCE | Admitting: Family Medicine

## 2017-06-12 ENCOUNTER — Ambulatory Visit: Payer: PRIVATE HEALTH INSURANCE | Admitting: Physician Assistant

## 2017-07-07 ENCOUNTER — Encounter: Payer: Self-pay | Admitting: Physician Assistant

## 2017-07-07 ENCOUNTER — Ambulatory Visit (INDEPENDENT_AMBULATORY_CARE_PROVIDER_SITE_OTHER): Payer: PRIVATE HEALTH INSURANCE | Admitting: Physician Assistant

## 2017-07-07 VITALS — BP 110/60 | HR 82 | Ht 64.0 in | Wt 151.0 lb

## 2017-07-07 DIAGNOSIS — R194 Change in bowel habit: Secondary | ICD-10-CM | POA: Diagnosis not present

## 2017-07-07 DIAGNOSIS — R110 Nausea: Secondary | ICD-10-CM | POA: Diagnosis not present

## 2017-07-07 DIAGNOSIS — K5900 Constipation, unspecified: Secondary | ICD-10-CM | POA: Diagnosis not present

## 2017-07-07 DIAGNOSIS — K219 Gastro-esophageal reflux disease without esophagitis: Secondary | ICD-10-CM

## 2017-07-07 DIAGNOSIS — R1084 Generalized abdominal pain: Secondary | ICD-10-CM

## 2017-07-07 MED ORDER — LINACLOTIDE 145 MCG PO CAPS: 145.0000 ug | ORAL_CAPSULE | Freq: Every day | ORAL | 2 refills | Status: DC

## 2017-07-07 MED ORDER — NA SULFATE-K SULFATE-MG SULF 17.5-3.13-1.6 GM/177ML PO SOLN: 1.0000 | ORAL | 0 refills | Status: DC

## 2017-07-07 NOTE — Patient Instructions (Signed)
We have sent the following medications to your pharmacy for you to pick up at your convenience: Linzess 145 mcg daily   You have been scheduled for a colonoscopy. Please follow written instructions given to you at your visit today.  Please pick up your prep supplies at the pharmacy within the next 1-3 days. If you use inhalers (even only as needed), please bring them with you on the day of your procedure. Your physician has requested that you go to www.startemmi.com and enter the access code given to you at your visit today. This web site gives a general overview about your procedure. However, you should still follow specific instructions given to you by our office regarding your preparation for the procedure.

## 2017-07-07 NOTE — Progress Notes (Signed)
Reviewed and agree with documentation and assessment and plan. K. Veena Nandigam , MD   

## 2017-07-07 NOTE — Progress Notes (Signed)
Chief Complaint: Generalized abdominal pain, constipation  HPI:    Paula Aguilar is a 62 year old female with a past medical history as listed below, who was referred to me by Dickie La, MD for a complaint of generalized abdominal pain and constipation.    Today, describes that on February 1 she came down with a "stomach virus".  She was very sick with a large amount of diarrhea and after about 48 hours this stopped and she did not have a bowel movement for about a week and a half.  Started with some generalized abdominal pain and proceded her primary care provider who diagnosed her with "chronic constipation".  She was started on MiraLAX as needed and describes going "once in a while" with this.  Returned to her primary care office and was told to increase this to once daily.  Worked for a while but then stopped and patient started with severe abdominal pain again.  Proceeded to pharmacy was started on mag citrate which helped, but now the MiraLAX is not working at all irregardless of using this daily.     When she is severely constipated she eats and becomes nauseous and sometimes vomits and her generalized abdominal pain is worse radiating into her sides.  Does continue to pass gas in the interim and this does provide her with some relief.  Can also get some "clear liquid or mucus" that comes out with the gas.  Associated symptoms include a weight loss of around 18 pounds in the past 3 months.  Prior to all this patient describes regular daily bowel movements.    Last colonoscopy at least 8-9 years ago per patient, unsure where this was done or what the results were.    Denies fever, chills, blood in her stool or symptoms that awaken her at night.  Past Medical History:  Diagnosis Date  . Arthritis   . Asthma   . Hypertension    not requiring medication since 09/2010  . Seasonal allergies   . Sinusitis     Past Surgical History:  Procedure Laterality Date  . CESAREAN SECTION    .  CHOLECYSTECTOMY    . KNEE ARTHROSCOPY     right side    Current Outpatient Medications  Medication Sig Dispense Refill  . albuterol (PROVENTIL HFA;VENTOLIN HFA) 108 (90 Base) MCG/ACT inhaler Inhale 2 puffs into the lungs 2 (two) times daily as needed for wheezing or shortness of breath. 1 Inhaler 0  . Fluticasone-Salmeterol (ADVAIR DISKUS) 250-50 MCG/DOSE AEPB Inhale 1 puff into the lungs 2 (two) times daily. 160 each 3  . hyoscyamine (ANASPAZ) 0.125 MG TBDP disintergrating tablet PLACE 1 TABLET UNDER TONGUE AS NEEDED FOR ESOPHAGUS SPASM MAX OF 3 TABS A DAY 30 tablet 1  . lisinopril-hydrochlorothiazide (PRINZIDE,ZESTORETIC) 20-12.5 MG tablet TAKE 2 TABLETS EVERY DAY 180 tablet 3  . meloxicam (MOBIC) 15 MG tablet Take 1 tablet (15 mg total) by mouth daily. 90 tablet 3  . mometasone (NASONEX) 50 MCG/ACT nasal spray Place one spray in each nostril each day 17 g 12  . montelukast (SINGULAIR) 10 MG tablet Take 1 tablet (10 mg total) daily by mouth. 90 tablet 3  . Multiple Vitamin (MULTIVITAMIN) tablet Take 1 tablet by mouth daily.      Marland Kitchen omeprazole (PRILOSEC) 40 MG capsule TAKE 1 CAPSULE (40 MG TOTAL) BY MOUTH 2 (TWO) TIMES DAILY. 180 capsule 3  . polyethylene glycol powder (GLYCOLAX/MIRALAX) powder Take 17 g by mouth daily. May take up to  three times a day if no improvement. 527 g 0  . PREMPRO 0.3-1.5 MG tablet TAKE 1 TABLET BY MOUTH DAILY. 84 tablet 3   No current facility-administered medications for this visit.     Allergies as of 07/07/2017  . (No Known Allergies)    Family History  Problem Relation Age of Onset  . Heart failure Mother   . COPD Mother   . Diabetes Mother   . Heart attack Father   . Colon cancer Maternal Uncle     Social History   Socioeconomic History  . Marital status: Married    Spouse name: Not on file  . Number of children: 3  . Years of education: Not on file  . Highest education level: Not on file  Occupational History  . Occupation: mail carrier    Social Needs  . Financial resource strain: Not on file  . Food insecurity:    Worry: Not on file    Inability: Not on file  . Transportation needs:    Medical: Not on file    Non-medical: Not on file  Tobacco Use  . Smoking status: Never Smoker  . Smokeless tobacco: Never Used  Substance and Sexual Activity  . Alcohol use: No  . Drug use: No  . Sexual activity: Not on file  Lifestyle  . Physical activity:    Days per week: Not on file    Minutes per session: Not on file  . Stress: Not on file  Relationships  . Social connections:    Talks on phone: Not on file    Gets together: Not on file    Attends religious service: Not on file    Active member of club or organization: Not on file    Attends meetings of clubs or organizations: Not on file    Relationship status: Not on file  . Intimate partner violence:    Fear of current or ex partner: Not on file    Emotionally abused: Not on file    Physically abused: Not on file    Forced sexual activity: Not on file  Other Topics Concern  . Not on file  Social History Narrative  . Not on file    Review of Systems:    Constitutional: No weight loss, fever or chills Skin: No rash  Cardiovascular: No chest pain Respiratory: No SOB  Gastrointestinal: See HPI and otherwise negative Genitourinary: No dysuria  Neurological: No headache, dizziness or syncope Musculoskeletal: No new muscle or joint pain Hematologic: No bleeding  Psychiatric: No history of depression or anxiety   Physical Exam:  Vital signs: BP 110/60   Pulse 82   Ht 5\' 4"  (1.626 m)   Wt 151 lb (68.5 kg)   BMI 25.92 kg/m   Constitutional:   Pleasant Caucasian female appears to be in NAD, Well developed, Well nourished, alert and cooperative Head:  Normocephalic and atraumatic. Eyes:   PEERL, EOMI. No icterus. Conjunctiva pink. Ears:  Normal auditory acuity. Neck:  Supple Throat: Oral cavity and pharynx without inflammation, swelling or lesion.   Respiratory: Respirations even and unlabored. Lungs clear to auscultation bilaterally.   No wheezes, crackles, or rhonchi.  Cardiovascular: Normal S1, S2. No MRG. Regular rate and rhythm. No peripheral edema, cyanosis or pallor.  Gastrointestinal:  Soft, nondistended, moderate generalized abdominal pain. No rebound or guarding. Normal bowel sounds. No appreciable masses or hepatomegaly. Rectal:  Not performed.  Msk:  Symmetrical without gross deformities. Without edema, no deformity or joint abnormality.  Neurologic:  Alert and  oriented x4;  grossly normal neurologically.  Skin:   Dry and intact without significant lesions or rashes. Psychiatric:Demonstrates good judgement and reason without abnormal affect or behaviors.  No recent labs or imaging.  Assessment: 1.  Change in bowel habits: Towards constipation over the past 3 months, unhelped by daily MiraLAX, started after what sounds like a viral GI illness; consider postinfectious IBS versus polyp versus other 2.  Constipation 3.  Generalized abdominal pain: Related to constipation, better when patient is able to have a bowel movement or passed gas 4. Nausea: with above 5. GERD: controlled on Prilosec  Plan: 1.  With change in bowel habits over the past 3 months recommend colonoscopy for further evaluation.  Discussed risk, benefits, limitations and alternatives and patient agrees to proceed.  Scheduled with Dr. Silverio Decamp. 2.  Started patient on Linzess 145 mcg daily.  Did provide patient with 2 weeks of samples.  Also prescribed #30 with 3 refills 3.  Patient to follow in clinic per recommendations from Dr. Silverio Decamp after time of procedure.  Ellouise Newer, PA-C [facility] 07/07/2017, 10:40 AM  Cc: Dickie La, MD

## 2017-07-20 ENCOUNTER — Encounter: Payer: Self-pay | Admitting: Gastroenterology

## 2017-07-20 ENCOUNTER — Ambulatory Visit (AMBULATORY_SURGERY_CENTER): Payer: PRIVATE HEALTH INSURANCE | Admitting: Gastroenterology

## 2017-07-20 ENCOUNTER — Other Ambulatory Visit: Payer: Self-pay

## 2017-07-20 VITALS — BP 148/91 | HR 70 | Temp 97.1°F | Resp 16 | Ht 64.0 in | Wt 151.0 lb

## 2017-07-20 DIAGNOSIS — R194 Change in bowel habit: Secondary | ICD-10-CM

## 2017-07-20 DIAGNOSIS — K5900 Constipation, unspecified: Secondary | ICD-10-CM

## 2017-07-20 DIAGNOSIS — Z5189 Encounter for other specified aftercare: Secondary | ICD-10-CM

## 2017-07-20 MED ORDER — LUBIPROSTONE 24 MCG PO CAPS: 24.0000 ug | ORAL_CAPSULE | Freq: Two times a day (BID) | ORAL | 3 refills | Status: DC

## 2017-07-20 MED ORDER — SODIUM CHLORIDE 0.9 % IV SOLN: 500.0000 mL | Freq: Once | INTRAVENOUS | Status: DC

## 2017-07-20 NOTE — Progress Notes (Signed)
To PACU, VSS. Report to [email or web address] 

## 2017-07-20 NOTE — Op Note (Addendum)
[facility] Patient Name: Paula Aguilar Procedure Date: 07/20/2017 3:18 PM MRN: [id number] Endoscopist: Mauri Pole , MD Age: 62 Referring MD:  Date of Birth: 1955-08-21 Gender: Female Account #: [id number] Procedure:                Colonoscopy Indications:              Generalized abdominal pain, Change in bowel habits,                            Constipation Medicines:                Monitored Anesthesia Care Procedure:                Pre-Anesthesia Assessment:                           - Prior to the procedure, a History and Physical                            was performed, and patient medications and                            allergies were reviewed. The patient's tolerance of                            previous anesthesia was also reviewed. The risks                            and benefits of the procedure and the sedation                            options and risks were discussed with the patient.                            All questions were answered, and informed consent                            was obtained. Prior Anticoagulants: The patient has                            taken no previous anticoagulant or antiplatelet                            agents. ASA Grade Assessment: II - A patient with                            mild systemic disease. After reviewing the risks                            and benefits, the patient was deemed in                            satisfactory condition to undergo the procedure.  After obtaining informed consent, the colonoscope                            was passed under direct vision. Throughout the                            procedure, the patient's blood pressure, pulse, and                            oxygen saturations were monitored continuously. The                            Model PCF-H190DL [id number]) scope was introduced                            through the anus and advanced to the the  cecum,                            identified by appendiceal orifice and ileocecal                            valve. The patient tolerated the procedure well.                            The colonoscopy was somewhat difficult due to a                            redundant colon, significant looping and a tortuous                            colon. Successful completion of the procedure was                            aided by applying abdominal pressure. The quality                            of the bowel preparation was adequate. The                            ileocecal valve, appendiceal orifice, and rectum                            were photographed. Scope In: 3:41:17 PM Scope Out: 4:10:42 PM Scope Withdrawal Time: 0 hours 12 minutes 51 seconds  Total Procedure Duration: 0 hours 29 minutes 25 seconds  Findings:                 The perianal and digital rectal examinations were                            normal.                           Non-bleeding internal hemorrhoids were found during  retroflexion. The hemorrhoids were small.                           The exam was otherwise without abnormality. Complications:            No immediate complications. Estimated Blood Loss:     Estimated blood loss: none. Impression:               - Non-bleeding internal hemorrhoids.                           - The examination was otherwise normal.                           - No specimens collected. Recommendation:           - Patient has a contact number available for                            emergencies. The signs and symptoms of potential                            delayed complications were discussed with the                            patient. Return to normal activities tomorrow.                            Written discharge instructions were provided to the                            patient.                           - Resume previous diet.                           -  Continue present medications.                           - Repeat colonoscopy in 10 years for screening                            purposes.                           - Amitiza 1mcg twice daily X 30 days with 3 refills                           - Follow up in office in 1-2 months Mauri Pole, MD 07/20/2017 4:15:59 PM This report has been signed electronically.

## 2017-07-20 NOTE — Patient Instructions (Addendum)
Thank you for  allowing [place] to care for you today!  Next colonoscopy in 10 years for screening.  Continue current medications and diet.  Start samples of Amitiza 24 mcg twice per day for a week.  Check with pharmacy to see how much your portion is. Calling in [place] to [facility] on [address].  Follow up in GI clinic in 2 months.  Call to make appointment.        YOU HAD AN ENDOSCOPIC PROCEDURE TODAY AT [facility]:   Refer to the procedure report that was given to you for any specific questions about what was found during the examination.  If the procedure report does not answer your questions, please call your gastroenterologist to clarify.  If you requested that your care partner not be given the details of your procedure findings, then the procedure report has been included in a sealed envelope for you to review at your convenience later.  YOU SHOULD EXPECT: Some feelings of bloating in the abdomen. Passage of more gas than usual.  Walking can help get rid of the air that was put into your GI tract during the procedure and reduce the bloating. If you had a lower endoscopy (such as a colonoscopy or flexible sigmoidoscopy) you may notice spotting of blood in your stool or on the toilet paper. If you underwent a bowel prep for your procedure, you may not have a normal bowel movement for a few days.  Please Note:  You might notice some irritation and congestion in your nose or some drainage.  This is from the oxygen used during your procedure.  There is no need for concern and it should clear up in a day or so.  SYMPTOMS TO REPORT IMMEDIATELY:   Following lower endoscopy (colonoscopy or flexible sigmoidoscopy):  Excessive amounts of blood in the stool  Significant tenderness or worsening of abdominal pains  Swelling of the abdomen that is new, acute  Fever of 100F or higher   Following upper endoscopy (EGD)  Vomiting of blood or coffee ground material  New chest pain  or pain under the shoulder blades  Painful or persistently difficult swallowing  New shortness of breath  Fever of 100F or higher  Black, tarry-looking stools  For urgent or emergent issues, a gastroenterologist can be reached at any hour by calling [phone].   DIET:  We do recommend a small meal at first, but then you may proceed to your regular diet.  Drink plenty of fluids but you should avoid alcoholic beverages for 24 hours.  ACTIVITY:  You should plan to take it easy for the rest of today and you should NOT DRIVE or use heavy machinery until tomorrow (because of the sedation medicines used during the test).    FOLLOW UP: Our staff will call the number listed on your records the next business day following your procedure to check on you and address any questions or concerns that you may have regarding the information given to you following your procedure. If we do not reach you, we will leave a message.  However, if you are feeling well and you are not experiencing any problems, there is no need to return our call.  We will assume that you have returned to your regular daily activities without incident.  If any biopsies were taken you will be contacted by phone or by letter within the next 1-3 weeks.  Please call us at [phone] if you have not heard about  the biopsies in 3 weeks.    SIGNATURES/CONFIDENTIALITY: You and/or your care partner have signed paperwork which will be entered into your electronic medical record.  These signatures attest to the fact that that the information above on your After Visit Summary has been reviewed and is understood.  Full responsibility of the confidentiality of this discharge information lies with you and/or your care-partner.YOU HAD AN ENDOSCOPIC PROCEDURE TODAY AT [facility]:   Refer to the procedure report that was given to you for any specific questions about what was found during the examination.  If the procedure report  does not answer your questions, please call your gastroenterologist to clarify.  If you requested that your care partner not be given the details of your procedure findings, then the procedure report has been included in a sealed envelope for you to review at your convenience later.  YOU SHOULD EXPECT: Some feelings of bloating in the abdomen. Passage of more gas than usual.  Walking can help get rid of the air that was put into your GI tract during the procedure and reduce the bloating. If you had a lower endoscopy (such as a colonoscopy or flexible sigmoidoscopy) you may notice spotting of blood in your stool or on the toilet paper. If you underwent a bowel prep for your procedure, you may not have a normal bowel movement for a few days.  Please Note:  You might notice some irritation and congestion in your nose or some drainage.  This is from the oxygen used during your procedure.  There is no need for concern and it should clear up in a day or so.  SYMPTOMS TO REPORT IMMEDIATELY:   Following lower endoscopy (colonoscopy or flexible sigmoidoscopy):  Excessive amounts of blood in the stool  Significant tenderness or worsening of abdominal pains  Swelling of the abdomen that is new, acute  Fever of 100F or higher   Following upper endoscopy (EGD)  Vomiting of blood or coffee ground material  New chest pain or pain under the shoulder blades  Painful or persistently difficult swallowing  New shortness of breath  Fever of 100F or higher  Black, tarry-looking stools  For urgent or emergent issues, a gastroenterologist can be reached at any hour by calling [phone].   DIET:  We do recommend a small meal at first, but then you may proceed to your regular diet.  Drink plenty of fluids but you should avoid alcoholic beverages for 24 hours.  ACTIVITY:  You should plan to take it easy for the rest of today and you should NOT DRIVE or use heavy machinery until tomorrow (because of the  sedation medicines used during the test).    FOLLOW UP: Our staff will call the number listed on your records the next business day following your procedure to check on you and address any questions or concerns that you may have regarding the information given to you following your procedure. If we do not reach you, we will leave a message.  However, if you are feeling well and you are not experiencing any problems, there is no need to return our call.  We will assume that you have returned to your regular daily activities without incident.  If any biopsies were taken you will be contacted by phone or by letter within the next 1-3 weeks.  Please call us at [phone] if you have not heard about the biopsies in 3 weeks.    SIGNATURES/CONFIDENTIALITY: You and/or your  care partner have signed paperwork which will be entered into your electronic medical record.  These signatures attest to the fact that that the information above on your After Visit Summary has been reviewed and is understood.  Full responsibility of the confidentiality of this discharge information lies with you and/or your care-partner.

## 2017-07-21 ENCOUNTER — Telehealth: Payer: Self-pay

## 2017-07-21 NOTE — Telephone Encounter (Signed)
  Follow up Call-  Call back number 07/20/2017  Post procedure Call Back phone  # [phone]  Permission to leave phone message Yes  Some recent data might be hidden     Patient questions:  Do you have a fever, pain , or abdominal swelling? Yes.   Pain Score  0 *  Have you tolerated food without any problems? Yes.    Have you been able to return to your normal activities? Yes.    Do you have any questions about your discharge instructions: Diet   No. Medications  No. Follow up visit  No.  Do you have questions or concerns about your Care? No.  Actions: * If pain score is 4 or above:  Pt. Back at work when I called, feels her abdomen is a bit swollen, still passing air.  Told pt. To have a hot drink of coffee or tea, walk around, rock on hands and knees if she has opportunity, and call back if things worsen, or she has questions or concerns. No action needed, pain <4.

## 2017-07-25 ENCOUNTER — Other Ambulatory Visit: Payer: Self-pay | Admitting: Family Medicine

## 2017-07-25 DIAGNOSIS — Z1231 Encounter for screening mammogram for malignant neoplasm of breast: Secondary | ICD-10-CM

## 2017-07-26 ENCOUNTER — Other Ambulatory Visit (HOSPITAL_COMMUNITY)
Admission: RE | Admit: 2017-07-26 | Discharge: 2017-07-26 | Disposition: A | Discharge status: Home / Self Care | Payer: PRIVATE HEALTH INSURANCE | Source: Ambulatory Visit | Attending: Family Medicine | Admitting: Family Medicine

## 2017-07-26 ENCOUNTER — Other Ambulatory Visit: Payer: Self-pay

## 2017-07-26 ENCOUNTER — Ambulatory Visit (INDEPENDENT_AMBULATORY_CARE_PROVIDER_SITE_OTHER): Payer: PRIVATE HEALTH INSURANCE | Admitting: Family Medicine

## 2017-07-26 VITALS — BP 118/80 | HR 62 | Temp 98.3°F | Ht 64.0 in | Wt 148.8 lb

## 2017-07-26 DIAGNOSIS — M79643 Pain in unspecified hand: Secondary | ICD-10-CM

## 2017-07-26 DIAGNOSIS — Z1151 Encounter for screening for human papillomavirus (HPV): Secondary | ICD-10-CM | POA: Insufficient documentation

## 2017-07-26 DIAGNOSIS — F4323 Adjustment disorder with mixed anxiety and depressed mood: Secondary | ICD-10-CM

## 2017-07-26 DIAGNOSIS — Z124 Encounter for screening for malignant neoplasm of cervix: Secondary | ICD-10-CM | POA: Insufficient documentation

## 2017-07-26 DIAGNOSIS — M19049 Primary osteoarthritis, unspecified hand: Secondary | ICD-10-CM

## 2017-07-26 DIAGNOSIS — R1084 Generalized abdominal pain: Secondary | ICD-10-CM | POA: Diagnosis not present

## 2017-07-26 MED ORDER — METHYLPREDNISOLONE ACETATE 40 MG/ML IJ SUSP
40.0000 mg | Freq: Once | INTRAMUSCULAR | Status: AC
  Administered 2017-07-26: 40 mg via INTRAMUSCULAR

## 2017-07-26 MED ORDER — CITALOPRAM HYDROBROMIDE 20 MG PO TABS: 20.0000 mg | ORAL_TABLET | Freq: Every day | ORAL | 3 refills | Status: DC

## 2017-07-26 NOTE — Patient Instructions (Signed)
I have sent in a Rx for citalopram. I think it may help your anxiety and your belly! Let me see you in a month  Today you received an injection with corticosteroid. This injection is usually done in response to pain and inflammation. There is some "numbing" medicine also in the shot so the injected area may be numb and feel really good for the next couple of hours. The numbing medicine usually wears off in 2-3 hours though, and then your pain level will be right back where it was before the injection.   The actually benefit from the steroid injection is usually noticed in 2-7 days. You may actually experience a small (as in 10%) INCREASE in pain in the first 24 hours---that is common.   Things to watch out for that you should contact us or a health care provider urgently would include: 1. Unusual (as in more than 10%) increase in pain 2. New fever > 101.5 3. New swelling or redness of the injected area.  4. Streaking of red lines around the area injected.

## 2017-07-27 LAB — CYTOLOGY - PAP
Diagnosis: NEGATIVE
HPV: NOT DETECTED

## 2017-07-28 ENCOUNTER — Encounter: Payer: Self-pay | Admitting: Family Medicine

## 2017-07-28 NOTE — Assessment & Plan Note (Addendum)
Increased stressors recently.  Will start low-dose citalopram and follow-up 1 month.  We discussed at length. Greater than 50% of our 25 minute office visit was spent in counseling and education regarding these issues.

## 2017-07-28 NOTE — Progress Notes (Signed)
    CHIEF COMPLAINT / HPI: Follow-up abdominal pain.  Had a colonoscopy.  They put her on a medicine for constipation that worked pretty well but was too expensive.  She has a call into their office to see if there is a generic form. 2.  She like to get her Pap smear today. 3.  A lot of home stressors.  Is been a death in the family.  She is having a lot of joint pain and is really making her cranky.  Finds she is got little patience and snaps at her spouse.  Feels irritable most of the time.  Not having problems sleeping.  No overt depressive symptoms. #4.  Left index knuckle pain.  Feels like her thumb did when it first started hurting.  She would like to consider an injection.  It is hampering her ability to continue to work as a [employer].  Aches most of the time.  Has tried over-the-counter pain reliever and topicals without much success.  I seems to make it worse.  REVIEW OF SYSTEMS: See HPI  PERTINENT  PMH / PSH: I have reviewed the patient's medications, allergies, past medical and surgical history, smoking status and updated in the EMR as appropriate.   OBJECTIVE:  Vital signs reviewed. GENERAL: Well-developed, well-nourished, no acute distress. CARDIOVASCULAR: Regular rate and rhythm no murmur gallop or rub LUNGS: Clear to auscultation bilaterally, no rales or wheeze. ABDOMEN: Soft positive bowel sounds NEURO: No gross focal neurological deficits. MSK: Movement of extremity x 4.  Left index MCP mildly tender to palpation with boggy synovium. gu: Externally atrophic.  No adnexal masses or tenderness.  Cervix appears normal. Psychiatric: Alert and oriented x4.  Affect is mildly flattened.  Speech is low volume, normal content.  No psychomotor retardation.  Recent remote memory is intact.  PROCEDURE: INJECTION: Patient was given informed consent, signed copy in the chart. Appropriate time out was taken. Area prepped and draped in usual sterile fashion. Ethyl chloride was  used  for local anesthesia. A 21 gauge 1 1/2 inch needle was used.. 1 cc of methylprednisolone 40 mg/ml plus  1 cc of 1% lidocaine without epinephrine was injected into the left index mcp joint using a(n) lateral approach.   The patient tolerated the procedure well. There were no complications. Post procedure instructions were given.   ASSESSMENT / PLAN: Please see problem oriented charting for details

## 2017-07-28 NOTE — Assessment & Plan Note (Signed)
She is being followed by GI.  We discussed her medications.

## 2017-08-02 ENCOUNTER — Encounter: Payer: Self-pay | Admitting: Family Medicine

## 2017-08-11 ENCOUNTER — Ambulatory Visit: Payer: PRIVATE HEALTH INSURANCE

## 2017-08-17 ENCOUNTER — Other Ambulatory Visit: Payer: Self-pay | Admitting: Family Medicine

## 2017-08-31 ENCOUNTER — Other Ambulatory Visit: Payer: Self-pay | Admitting: Family Medicine

## 2017-08-31 ENCOUNTER — Ambulatory Visit
Admission: RE | Admit: 2017-08-31 | Discharge: 2017-08-31 | Disposition: A | Discharge status: Home / Self Care | Payer: PRIVATE HEALTH INSURANCE | Source: Ambulatory Visit | Attending: Family Medicine | Admitting: Family Medicine

## 2017-08-31 DIAGNOSIS — Z1231 Encounter for screening mammogram for malignant neoplasm of breast: Secondary | ICD-10-CM

## 2017-10-08 ENCOUNTER — Other Ambulatory Visit: Payer: Self-pay | Admitting: Family Medicine

## 2017-10-20 ENCOUNTER — Other Ambulatory Visit: Payer: Self-pay | Admitting: Physician Assistant

## 2017-10-30 ENCOUNTER — Other Ambulatory Visit: Payer: Self-pay | Admitting: Family Medicine

## 2017-11-15 ENCOUNTER — Other Ambulatory Visit: Payer: Self-pay | Admitting: Gastroenterology

## 2018-01-16 ENCOUNTER — Other Ambulatory Visit: Payer: Self-pay | Admitting: Physician Assistant

## 2018-04-02 ENCOUNTER — Other Ambulatory Visit: Payer: Self-pay | Admitting: Family Medicine

## 2018-04-17 ENCOUNTER — Ambulatory Visit: Payer: 59 | Admitting: Family Medicine

## 2018-04-17 ENCOUNTER — Other Ambulatory Visit: Payer: Self-pay

## 2018-04-17 ENCOUNTER — Encounter: Payer: Self-pay | Admitting: Family Medicine

## 2018-04-17 VITALS — BP 122/84 | HR 61 | Temp 97.9°F | Ht 64.0 in | Wt 156.8 lb

## 2018-04-17 DIAGNOSIS — M19049 Primary osteoarthritis, unspecified hand: Secondary | ICD-10-CM

## 2018-04-17 DIAGNOSIS — M19041 Primary osteoarthritis, right hand: Secondary | ICD-10-CM

## 2018-04-17 DIAGNOSIS — F4323 Adjustment disorder with mixed anxiety and depressed mood: Secondary | ICD-10-CM | POA: Diagnosis not present

## 2018-04-17 MED ORDER — CITALOPRAM HYDROBROMIDE 40 MG PO TABS: 40.0000 mg | ORAL_TABLET | Freq: Every day | ORAL | 1 refills | Status: DC

## 2018-04-17 MED ORDER — ALBUTEROL SULFATE HFA 108 (90 BASE) MCG/ACT IN AERS: 2.0000 | INHALATION_SPRAY | Freq: Two times a day (BID) | RESPIRATORY_TRACT | 0 refills | Status: DC | PRN

## 2018-04-17 NOTE — Patient Instructions (Signed)
Let me see you back in 3 to 4 weeks.  In the meantime, go by at your convenience to [place] and get your x-rays of your hands done.  We will talk about them when I see you back.  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  I have called in a new prescription for citalopram 40 mg.  Start taking that instead of the 20.  I do not think he will notice a big difference but in 3 or 4 weeks we will look and see which dose is better for you to 20, or the 40.  If you have any 20s left, just put them in the back of the medicine chest for right now.  I will fill out your FMLA and have it at the desk tomorrow.  See you in 3 to 4 weeks.

## 2018-04-20 NOTE — Assessment & Plan Note (Signed)
We will get repeat x-rays.  I would like to see both hands that she has some deformity in both hands not like to know where she is as a baseline.  She is planning to work 3-5 more years and is concerned about this. Corticosteroid injection today in the right MCP.

## 2018-04-20 NOTE — Progress Notes (Signed)
    CHIEF COMPLAINT / HPI: #1.  Follow-up for hand arthritis: The injection really helped.  She is having multiple other finger and thumb joint pains but not as bad as previously.  She would like to get a second shot as she is almost back to the baseline pain she had prior to the injection. 2.  Had started on citalopram at last office visit and she feels much better.  Her family has noticed that she is less irritable.  She does not feel like she is quite back to her baseline yet but is encouraged by the improvement.  No side effects are noticeable at this time. #3.  Home stressors: Her live-in mother-in-law is getting more frail.  Is causing her to have to do a lot of additional caretaking activities which she does not feel should be her place.  This causes some dizziness within the family.  She also needs me to fill out FMLA form so that she can occasionally take off to take care of both herself from a physical and mental health issue as well as take family member to physicians appointments intermittently.  REVIEW OF SYSTEMS: No unusual weight change.  No fever.  See HPI for additional pertinent review of systems.  Mood is improved.  No suicidal or homicidal ideation.  Sleep is better but not back to baseline.  No agitation.  PERTINENT  PMH / PSH: I have reviewed the patient's medications, allergies, past medical and surgical history, smoking status and updated in the EMR as appropriate.   OBJECTIVE:  Vital signs reviewed. GENERAL: Well-developed, well-nourished, no acute distress. CARDIOVASCULAR: Regular rate and rhythm no murmur gallop or rub LUNGS: Clear to auscultation bilaterally, no rales or wheeze. ABDOMEN: Soft positive bowel sounds NEURO: No gross focal neurological deficits. MSK: Movement of extremity x 4. Mild to moderate deformity of various PIP and DIP joints.  The MCP on index finger of both hands reveal some synovial hypertrophy with tenderness.  This is worse on the right  hand.  PROCEDURE: INJECTION: Patient was given informed consent, signed copy in the chart. Appropriate time out was taken. Area prepped and draped in usual sterile fashion. Ethyl chloride was  used for local anesthesia. A 21 gauge 1 1/2 inch needle was used.. One half cc of methylprednisolone 40 mg/ml plus one half cc of 1% lidocaine without epinephrine was injected into the right MCP joint using a(n) medial approach.   The patient tolerated the procedure well. There were no complications. Post procedure instructions were given.      ASSESSMENT / PLAN:  No problem-specific Assessment & Plan notes found for this encounter.

## 2018-04-20 NOTE — Assessment & Plan Note (Signed)
Kniffen improvement on low-dose citalopram.  I would like to take her to the typical adult dose of 40 mg daily.  We discussed at length spending greater than 50% of our 25-minute office visit in counseling and education regarding risk benefits of treatment and dose increase.  I will see her back in 3 to 5 weeks.  She will call in the interim with problems.

## 2018-05-09 ENCOUNTER — Other Ambulatory Visit: Payer: Self-pay | Admitting: Family Medicine

## 2018-05-14 ENCOUNTER — Other Ambulatory Visit: Payer: Self-pay | Admitting: Family Medicine

## 2018-05-16 ENCOUNTER — Ambulatory Visit: Payer: 59 | Admitting: Family Medicine

## 2018-06-03 ENCOUNTER — Other Ambulatory Visit: Payer: Self-pay | Admitting: Family Medicine

## 2018-06-20 ENCOUNTER — Other Ambulatory Visit: Payer: Self-pay | Admitting: Family Medicine

## 2018-06-20 DIAGNOSIS — I10 Essential (primary) hypertension: Secondary | ICD-10-CM

## 2018-07-02 ENCOUNTER — Other Ambulatory Visit: Payer: Self-pay | Admitting: Family Medicine

## 2018-07-05 ENCOUNTER — Other Ambulatory Visit: Payer: Self-pay | Admitting: Family Medicine

## 2018-07-06 MED ORDER — MOMETASONE FUROATE 50 MCG/ACT NA SUSP: NASAL | 1 refills | Status: DC

## 2018-07-06 NOTE — Addendum Note (Signed)
Addended by: Christen Bame D on: 07/06/2018 08:52 AM   Modules accepted: Orders

## 2018-07-06 NOTE — Telephone Encounter (Signed)
1st transmission failed.  Will resend now. Christen Bame, CMA

## 2018-08-29 ENCOUNTER — Ambulatory Visit (INDEPENDENT_AMBULATORY_CARE_PROVIDER_SITE_OTHER): Payer: 59 | Admitting: Family Medicine

## 2018-08-29 ENCOUNTER — Encounter: Payer: Self-pay | Admitting: Family Medicine

## 2018-08-29 ENCOUNTER — Other Ambulatory Visit: Payer: Self-pay

## 2018-08-29 DIAGNOSIS — M25552 Pain in left hip: Secondary | ICD-10-CM | POA: Diagnosis not present

## 2018-08-29 DIAGNOSIS — I10 Essential (primary) hypertension: Secondary | ICD-10-CM | POA: Diagnosis not present

## 2018-08-29 DIAGNOSIS — M19049 Primary osteoarthritis, unspecified hand: Secondary | ICD-10-CM

## 2018-08-29 DIAGNOSIS — F4323 Adjustment disorder with mixed anxiety and depressed mood: Secondary | ICD-10-CM | POA: Diagnosis not present

## 2018-08-31 DIAGNOSIS — M19049 Primary osteoarthritis, unspecified hand: Secondary | ICD-10-CM | POA: Insufficient documentation

## 2018-08-31 DIAGNOSIS — M25559 Pain in unspecified hip: Secondary | ICD-10-CM | POA: Insufficient documentation

## 2018-08-31 MED ORDER — METHYLPREDNISOLONE ACETATE 40 MG/ML IJ SUSP: 80.0000 mg | Freq: Once | INTRAMUSCULAR | Status: DC

## 2018-08-31 NOTE — Progress Notes (Signed)
    CHIEF COMPLAINT / HPI: 1. Left hip pain. Similar to last year and we gave her injection then that really helped. Thinks it is acting up as she has been doing more lifting wih caretaking activities of her mother in law. 2. Led]ft index finger knuckle is swelling again, painful. Throbs. Has been slightly warm and tender. Wants another shit 3/Doing well on her SSRI. Says it has been very helpful as increased stress at home with declining health of her mother in law. 4. BP: no chest pain. Taking meds regurlarly without problems.  REVIEW OF SYSTEMS: Pertinent review of systems: negative for fever or unusual weight change. See hpi  PERTINENT  PMH / PSH: I have reviewed the patient's medications, allergies, past medical and surgical history, smoking status and updated in the EMR as appropriate.   OBJECTIVE: Vital signs reviewed. GENERAL: Well-developed, well-nourished, no acute distress. CARDIOVASCULAR: Regular rate and rhythm no murmur gallop or rub LUNGS: Clear to auscultation bilaterally, no rales or wheeze. ABDOMEN: Soft positive bowel sounds NEURO: No gross focal neurological deficits. MSK: Movement of extremity x 4.Left hip TTP over greater trochan  ter and palpation reproduces pain.FROM IR/ER and painless movement Left index MCP with deformity, tt. Boggy synovium.   PSYCH: AxOx4. Good eye contact.. No psychomotor retardation or agitation. Appropriate speech fluency and content. Asks and answers questions appropriately. Mood is congruent.  PROCEDURE: INJECTION: Patient was given informed consent, signed copy in the chart. Appropriate time out was taken. Area prepped and draped in usual sterile fashion. Ethyl chloride was  used for local anesthesia. A 21 gauge 1 1/2 inch needle was used.. One half cc of a 1:1 mic]xture of 1  cc of methylprednisolone 40 mg/ml plus  1 cc of 1% lidocaine without epinephrine was injected into the left index MCP joint using a(n) perpendicular medial  approach.   The patient tolerated the procedure well. There were no complications. Post procedure instructions were given.  PROCEDURE: INJECTION: Patient was given informed consent, signed copy in the chart. Appropriate time out was taken. Area prepped and draped in usual sterile fashion. Ethyl chloride was  used for local anesthesia. A 21 gauge 1 1/2 inch needle was used..  One cc of methylprednisolone 40 mg/ml plus 4 cc of 1% lidocaine without epinephrine was injected into the Left greater trochanteric hip bursa using a(n) lateral approach.   The patient tolerated the procedure well. There were no complications. Post procedure instructions were given.    ASSESSMENT / PLAN:   No problem-specific Assessment & Plan notes found for this encounter.

## 2018-08-31 NOTE — Assessment & Plan Note (Signed)
Doing well. Continue current meds 

## 2018-08-31 NOTE — Assessment & Plan Note (Signed)
CSI LEFT index MCP today

## 2018-08-31 NOTE — Assessment & Plan Note (Signed)
Improved on SSRI and will continue current dose

## 2018-08-31 NOTE — Assessment & Plan Note (Signed)
CSI today

## 2018-09-24 ENCOUNTER — Other Ambulatory Visit: Payer: Self-pay

## 2018-09-24 MED ORDER — MELOXICAM 15 MG PO TABS: 15.0000 mg | ORAL_TABLET | Freq: Every day | ORAL | 3 refills | Status: DC

## 2018-10-02 ENCOUNTER — Other Ambulatory Visit: Payer: Self-pay | Admitting: *Deleted

## 2018-10-02 MED ORDER — OMEPRAZOLE 40 MG PO CPDR: DELAYED_RELEASE_CAPSULE | ORAL | 3 refills | Status: DC

## 2018-11-15 ENCOUNTER — Other Ambulatory Visit: Payer: Self-pay

## 2018-11-15 ENCOUNTER — Ambulatory Visit: Payer: 59 | Admitting: Family Medicine

## 2018-11-15 ENCOUNTER — Encounter: Payer: Self-pay | Admitting: Family Medicine

## 2018-11-15 DIAGNOSIS — Z23 Encounter for immunization: Secondary | ICD-10-CM

## 2018-11-15 DIAGNOSIS — M4722 Other spondylosis with radiculopathy, cervical region: Secondary | ICD-10-CM | POA: Diagnosis not present

## 2018-11-15 DIAGNOSIS — M75101 Unspecified rotator cuff tear or rupture of right shoulder, not specified as traumatic: Secondary | ICD-10-CM | POA: Diagnosis not present

## 2018-11-15 MED ORDER — GABAPENTIN 100 MG PO CAPS: 100.0000 mg | ORAL_CAPSULE | Freq: Three times a day (TID) | ORAL | 3 refills | Status: DC

## 2018-11-15 NOTE — Patient Instructions (Signed)
I think you have two problems 1. A flair of your rotator cuff problem 2. A pinched nerve for the arthritis in your neck.  The shot I gave you is for your shoulder. The medicine I gave you is for the pinched nerve pain.

## 2018-11-16 ENCOUNTER — Encounter: Payer: Self-pay | Admitting: Family Medicine

## 2018-11-16 DIAGNOSIS — M7501 Adhesive capsulitis of right shoulder: Secondary | ICD-10-CM | POA: Insufficient documentation

## 2018-11-16 DIAGNOSIS — M7500 Adhesive capsulitis of unspecified shoulder: Secondary | ICD-10-CM | POA: Insufficient documentation

## 2018-11-16 DIAGNOSIS — M75101 Unspecified rotator cuff tear or rupture of right shoulder, not specified as traumatic: Secondary | ICD-10-CM | POA: Insufficient documentation

## 2018-11-16 NOTE — Assessment & Plan Note (Signed)
We will see response to injection.  Also ROM exercises.

## 2018-11-16 NOTE — Progress Notes (Signed)
Established Patient Office Visit  Subjective:  Patient ID: Paula Aguilar, female    DOB: 08-Jul-1955  Age: 63 y.o. MRN: [id number]  CC:  Chief Complaint  Patient presents with  . Shoulder Pain    HPI Paula Aguilar presents for right shoulder pain and neck pain.  3 week duration.  Patient has stiffness in the neckright>left.  Also has pain mostly localized in the right shoulder with pain on lifting arm overhead.  She also describes pain and numbness radiating down the right arm to the elbow.  Remotely had right shoulder problem and had MRI of shoulder which she believes showed a torn rotator cuff.  Reviewed old neck x rays which show arthritis changes.  Past Medical History:  Diagnosis Date  . Arthritis   . Asthma   . Blood transfusion without reported diagnosis 1976   post cesarean section  . GERD (gastroesophageal reflux disease)   . Hypertension    not requiring medication since 09/2010  . Seasonal allergies   . Sinusitis     Past Surgical History:  Procedure Laterality Date  . AUGMENTATION MAMMAPLASTY    . CESAREAN SECTION    . CHOLECYSTECTOMY    . COLONOSCOPY    . KNEE ARTHROSCOPY     right side    Family History  Problem Relation Age of Onset  . Heart failure Mother   . COPD Mother   . Diabetes Mother   . Heart attack Father   . Colon cancer Maternal Uncle   . Esophageal cancer Neg Hx   . Rectal cancer Neg Hx   . Stomach cancer Neg Hx     Social History   Socioeconomic History  . Marital status: Married    Spouse name: Not on file  . Number of children: 3  . Years of education: Not on file  . Highest education level: Not on file  Occupational History  . Occupation: mail carrier  Social Needs  . Financial resource strain: Not on file  . Food insecurity    Worry: Not on file    Inability: Not on file  . Transportation needs    Medical: Not on file    Non-medical: Not on file  Tobacco Use  . Smoking status: Never Smoker  . Smokeless tobacco:  Never Used  Substance and Sexual Activity  . Alcohol use: No  . Drug use: No  . Sexual activity: Not on file  Lifestyle  . Physical activity    Days per week: Not on file    Minutes per session: Not on file  . Stress: Not on file  Relationships  . Social Herbalist on phone: Not on file    Gets together: Not on file    Attends religious service: Not on file    Active member of club or organization: Not on file    Attends meetings of clubs or organizations: Not on file    Relationship status: Not on file  . Intimate partner violence    Fear of current or ex partner: Not on file    Emotionally abused: Not on file    Physically abused: Not on file    Forced sexual activity: Not on file  Other Topics Concern  . Not on file  Social History Narrative  . Not on file    Outpatient Medications Prior to Visit  Medication Sig Dispense Refill  . ADVAIR DISKUS 250-50 MCG/DOSE AEPB TAKE 1 PUFF BY MOUTH TWICE A  DAY 180 each 3  . albuterol (PROVENTIL HFA;VENTOLIN HFA) 108 (90 Base) MCG/ACT inhaler INHALE 2 PUFFS INTO THE LUNGS 2 (TWO) TIMES DAILY AS NEEDED FOR WHEEZING OR SHORTNESS OF BREATH. 6.7 Inhaler 0  . AMITIZA 24 MCG capsule TAKE 1 CAPSULE (24 MCG TOTAL) BY MOUTH 2 (TWO) TIMES DAILY WITH A MEAL. 180 capsule 1  . citalopram (CELEXA) 20 MG tablet TAKE 1 TABLET BY MOUTH EVERY DAY 90 tablet 2  . citalopram (CELEXA) 40 MG tablet TAKE 1 TABLET BY MOUTH EVERY DAY 90 tablet 1  . hyoscyamine (ANASPAZ) 0.125 MG TBDP disintergrating tablet PLACE 1 TABLET UNDER TONGUE AS NEEDED FOR ESOPHAGUS SPASM MAX OF 3 TABS A DAY 30 tablet 1  . linaclotide (LINZESS) 145 MCG CAPS capsule Take 1 capsule (145 mcg total) by mouth daily before breakfast. 90 capsule 3  . lisinopril-hydrochlorothiazide (ZESTORETIC) 20-12.5 MG tablet TAKE 2 TABLETS EVERY DAY 180 tablet 3  . meloxicam (MOBIC) 15 MG tablet Take 1 tablet (15 mg total) by mouth daily. 90 tablet 3  . mometasone (NASONEX) 50 MCG/ACT nasal spray  PLACE ONE SPRAY IN EACH NOSTRIL EACH DAY 17 g 1  . montelukast (SINGULAIR) 10 MG tablet Take 1 tablet (10 mg total) daily by mouth. 90 tablet 3  . Multiple Vitamin (MULTIVITAMIN) tablet Take 1 tablet by mouth daily.      Marland Kitchen omeprazole (PRILOSEC) 40 MG capsule TAKE 1 CAPSULE (40 MG TOTAL) BY MOUTH 2 (TWO) TIMES DAILY. 180 capsule 3  . polyethylene glycol powder (GLYCOLAX/MIRALAX) powder Take 17 g by mouth daily. May take up to three times a day if no improvement. 527 g 0  . PREMPRO 0.3-1.5 MG tablet TAKE 1 TABLET BY MOUTH DAILY. 84 tablet 3   Facility-Administered Medications Prior to Visit  Medication Dose Route Frequency Provider Last Rate Last Dose  . 0.9 %  sodium chloride infusion  500 mL Intravenous Once Nandigam, Kavitha V, MD      . methylPREDNISolone acetate (DEPO-MEDROL) injection 80 mg  80 mg Intra-articular Once Dickie La, MD        No Known Allergies  ROS Review of Systems    Objective:    Physical Exam  BP 100/72   Pulse 76   Wt 166 lb 3.2 oz (75.4 kg)   SpO2 98%   BMI 28.53 kg/m  Wt Readings from Last 3 Encounters:  11/15/18 166 lb 3.2 oz (75.4 kg)  04/17/18 156 lb 12.8 oz (71.1 kg)  07/26/17 148 lb 12.8 oz (67.5 kg)  Good strength of upper ext.  Does have pain on right arm extension against resistance.  Normal reflexes. Neck lacks full ROM to right lateral turn.  Informed consent and discussion about diagnostic and thereutic reasons for right shoulder injection.  Injected right shoulder with 3 cc xylocain and 80 mg depo medrol with fair immediate relief just of shoulder discomfort.   Health Maintenance Due  Topic Date Due  . Hepatitis C Screening  1955/11/19  . HIV Screening  09/28/1971  . TETANUS/TDAP  04/25/2018    There are no preventive care reminders to display for this patient.  Lab Results  Component Value Date   TSH 2.961 06/26/2013   Lab Results  Component Value Date   WBC 7.3 03/25/2015   HGB 15.3 (H) 03/25/2015   HCT 45.6 03/25/2015    MCV 91.6 03/25/2015   PLT 291 03/25/2015   Lab Results  Component Value Date   NA 141 05/04/2016   K 4.4 05/04/2016  CO2 31 05/04/2016   GLUCOSE 101 (H) 05/04/2016   BUN 20 05/04/2016   CREATININE 0.76 05/04/2016   BILITOT 0.4 03/16/2016   ALKPHOS 44 03/16/2016   AST 20 03/16/2016   ALT 9 03/16/2016   PROT 5.4 (L) 03/16/2016   ALBUMIN 3.2 (L) 03/16/2016   CALCIUM 8.7 05/04/2016   Lab Results  Component Value Date   CHOL 192 06/17/2009   Lab Results  Component Value Date   HDL 55 06/17/2009   Lab Results  Component Value Date   LDLCALC 125 (H) 06/17/2009   Lab Results  Component Value Date   TRIG 62 06/17/2009   Lab Results  Component Value Date   CHOLHDL 3.5 Ratio 06/17/2009   No results found for: HGBA1C    Assessment & Plan:   Problem List Items Addressed This Visit    Osteoarthritis of spine with radiculopathy, cervical region   Relevant Medications   gabapentin (NEURONTIN) 100 MG capsule    Other Visit Diagnoses    Need for immunization against influenza       Relevant Orders   Flu Vaccine QUAD 36+ mos IM (Completed)      Meds ordered this encounter  Medications  . gabapentin (NEURONTIN) 100 MG capsule    Sig: Take 1 capsule (100 mg total) by mouth 3 (three) times daily.    Dispense:  90 capsule    Refill:  3    Follow-up: No follow-ups on file.    Zenia Resides, MD

## 2018-11-16 NOTE — Assessment & Plan Note (Signed)
I believe her problem is two-fold with both a c-spine with radiculopathy componant and a rotator cuff syndrome componant.  Given gabapentin for radicular sx.

## 2018-12-19 ENCOUNTER — Ambulatory Visit: Payer: 59 | Admitting: Family Medicine

## 2019-01-02 ENCOUNTER — Ambulatory Visit: Payer: 59 | Admitting: Family Medicine

## 2019-01-02 ENCOUNTER — Encounter: Payer: Self-pay | Admitting: Family Medicine

## 2019-01-02 ENCOUNTER — Other Ambulatory Visit: Payer: Self-pay

## 2019-01-02 DIAGNOSIS — M7501 Adhesive capsulitis of right shoulder: Secondary | ICD-10-CM

## 2019-01-02 DIAGNOSIS — M4722 Other spondylosis with radiculopathy, cervical region: Secondary | ICD-10-CM | POA: Diagnosis not present

## 2019-01-02 DIAGNOSIS — M19042 Primary osteoarthritis, left hand: Secondary | ICD-10-CM

## 2019-01-02 NOTE — Assessment & Plan Note (Signed)
Right shoulder: May have started with some cervical radiculopathy and decreased range of use of the shoulder.  She may also have some rotator cuff syndrome.  I think we need to start her on serial high-volume glenohumeral joint injections and have set her up for that.  Explained in detail today and she is in agreement with plan.

## 2019-01-02 NOTE — Assessment & Plan Note (Signed)
Quite a bit of synovial deformity.  We will try CSI today.  She has done well with those in the past.

## 2019-01-02 NOTE — Progress Notes (Signed)
    CHIEF COMPLAINT / HPI: #1.  Left index knuckle pain.  Has also started swelling quite a bit.  Similar to her other arthritis pains.  Would like to consider CSI.  It is bothering her so much she is having difficulty doing everyday tasks. #2.  Right shoulder pain.  At last office visit she received some type of shoulder injection.  Did not seem to help.  She has pain with elevation above shoulder height.  Really hampering her everyday activities.  Not having any numbness or tingling in the hand.  Does not feel weak. 3.  Continued neck pain posterior, right worse than left.  No headaches.  No visual changes.  No dizziness  REVIEW OF SYSTEMS: See HPI  PERTINENT  PMH / PSH: I have reviewed the patient's medications, allergies, past medical and surgical history, smoking status and updated in the EMR as appropriate.   OBJECTIVE:  Vital signs reviewed. GENERAL: Well-developed, well-nourished, no acute distress. CARDIOVASCULAR: Regular rate and rhythm no murmur gallop or rub LUNGS: Clear to auscultation bilaterally, no rales or wheeze. ABDOMEN: Soft positive bowel sounds NEURO: No gross focal neurological deficits. MSK: Movement of extremity x 4. Hand: Left index MCP quite swollen with boggy synovium.  No unusual warmth.  Range of motion mildly decreased in flexion. NEURO: Intact sensation to soft touch bilateral hands. Shoulder: Right.  Decreased passive and active range of motion above 90 degrees in lateral abduction and forward flexion.  This reproduces her pain.  No tenderness to the [facility] joint area.  PROCEDURE: INJECTION: Patient was given informed consent, signed copy in the chart. Appropriate time out was taken. Area prepped and draped in usual sterile fashion. Ethyl chloride was  used for local anesthesia. A 21 gauge 1 1/2 inch needle was used.. One half cc of methylprednisolone 40 mg/ml plus one half cc of 1% lidocaine without epinephrine was injected into the left MCP of the index finger  using a(n) lateral approach.   The patient tolerated the procedure well. There were no complications. Post procedure instructions were given.    ASSESSMENT / PLAN:   Arthritis of finger of left hand Quite a bit of synovial deformity.  We will try CSI today.  She has done well with those in the past.  Adhesive capsulitis Right shoulder: May have started with some cervical radiculopathy and decreased range of use of the shoulder.  She may also have some rotator cuff syndrome.  I think we need to start her on serial high-volume glenohumeral joint injections and have set her up for that.  Explained in detail today and she is in agreement with plan.  Osteoarthritis of spine with radiculopathy, cervical region She is got a lot of muscle tension here but the radicular symptoms have improved.  Were going to work on her shoulder and that may also improve her neck pain.  I think most of it is from muscle spasm that is chronic.

## 2019-01-02 NOTE — Patient Instructions (Signed)
Let me see you at sports medicine this Friday at 11:30 and we will look at your shoulder

## 2019-01-02 NOTE — Assessment & Plan Note (Signed)
She is got a lot of muscle tension here but the radicular symptoms have improved.  Were going to work on her shoulder and that may also improve her neck pain.  I think most of it is from muscle spasm that is chronic.

## 2019-01-04 ENCOUNTER — Ambulatory Visit: Payer: Self-pay

## 2019-01-04 ENCOUNTER — Other Ambulatory Visit: Payer: Self-pay

## 2019-01-04 ENCOUNTER — Ambulatory Visit: Payer: 59 | Admitting: Family Medicine

## 2019-01-04 VITALS — BP 102/74 | Ht 64.0 in | Wt 165.0 lb

## 2019-01-04 DIAGNOSIS — M7501 Adhesive capsulitis of right shoulder: Secondary | ICD-10-CM

## 2019-01-04 MED ORDER — METHYLPREDNISOLONE ACETATE 40 MG/ML IJ SUSP
40.0000 mg | Freq: Once | INTRAMUSCULAR | Status: AC
  Administered 2019-01-04: 40 mg via INTRA_ARTICULAR

## 2019-01-06 NOTE — Progress Notes (Signed)
  Paula Aguilar - 63 y.o. female MRN [id number]  Date of birth: 1956/01/18    SUBJECTIVE:      Chief Complaint:/ HPI:  Right shoulder pain.  Several weeks.  Stiffness was not improved after subacromial bursa injection.  Has had problems ongoing for many weeks but over the last 1 month has been extremely stiff.  No specific injury.   ROS:     No fever, no cough, no unusual weight change.  See HPI.  PERTINENT  PMH / PSH FH / / SH:  Past Medical, Surgical, Social, and Family History Reviewed & Updated in the EMR.  Pertinent findings include:  Multiple other sites of primary osteoarthritis.  OBJECTIVE: BP 102/74   Ht 5\' 4"  (1.626 m)   Wt 165 lb (74.8 kg)   BMI 28.32 kg/m   Physical Exam:  Vital signs are reviewed. GENERAL: Well-developed female no acute distress shoulders: Symmetrical: Right shoulder has limited range of motion above 90 degrees in abduction.  Can forward flex to 110 degrees.  Passive or active range of motion above this is quite limited secondary to stiffness and pain.  Internal and external rotation is intact and symmetrical to the other side. NEURO: Intact sensation soft touch bilateral hands VASCULAR: Radial pulses 2+ bilaterally symmetrical  PROCEDURE: INJECTION: Patient was given informed consent, signed copy in the chart. Appropriate time out was taken. Area prepped and draped in usual sterile fashion. Ethyl chloride was  used for local anesthesia. A 21 gauge 1 1/2 inch needle was used.. 1 cc of methylprednisolone 40 mg/ml plus 9 cc of 1% lidocaine without epinephrine was injected into the right glenohumeral joint using a(n) ultrasound guided approach.   The patient tolerated the procedure well. There were no complications. Post procedure instructions were given.   ASSESSMENT & PLAN:  See problem based charting & AVS for pt instructions. Adhesive capsulitis Combination of rotator  cuff syndrome and adhesive capsulitis which may be subsequent to the rotator cuff  syndrome.  We discussed options.  The subacromial bursa injection did not help but for a couple of days.  We will try serial large-volume glenohumeral joint injection under ultrasound with home exercise program.  I will start her on some pendulum exercises and wall crawls and follow her up in 3 weeks.

## 2019-01-06 NOTE — Assessment & Plan Note (Signed)
Combination of rotator  cuff syndrome and adhesive capsulitis which may be subsequent to the rotator cuff syndrome.  We discussed options.  The subacromial bursa injection did not help but for a couple of days.  We will try serial large-volume glenohumeral joint injection under ultrasound with home exercise program.  I will start her on some pendulum exercises and wall crawls and follow her up in 3 weeks.

## 2019-02-01 ENCOUNTER — Other Ambulatory Visit: Payer: Self-pay

## 2019-02-01 ENCOUNTER — Ambulatory Visit: Payer: 59 | Admitting: Family Medicine

## 2019-02-01 ENCOUNTER — Ambulatory Visit: Payer: Self-pay

## 2019-02-01 VITALS — BP 120/70 | Ht 64.0 in | Wt 165.0 lb

## 2019-02-01 DIAGNOSIS — M7501 Adhesive capsulitis of right shoulder: Secondary | ICD-10-CM

## 2019-02-01 MED ORDER — METHYLPREDNISOLONE ACETATE 40 MG/ML IJ SUSP
40.0000 mg | Freq: Once | INTRAMUSCULAR | Status: AC
  Administered 2019-02-01: 40 mg via INTRA_ARTICULAR

## 2019-02-01 NOTE — Assessment & Plan Note (Signed)
Patient comes in for follow-up for adhesive capsulitis, we had started a serial program of large dose glenohumeral joint injections.  After the last one approximately 3 weeks ago she did have reported improvement to her pain and range of motion.  Has been doing her rehab exercises approximately 3 to 4 days/week.  Feels over the last week that her range of motion has restricted slightly again and pain is started to increase.  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  Right shoulder range of motion is improved from last visit on exam, still limited slightly in abduction to approximately 90 degrees, flexion to approximate 120 degrees.  Slightly limited range of motion to external rotation and pain with active testing.  Still limited but improved from last visit internal rotation.  1:8 ratio injection of methyl Pred and bupivacaine using posterior approach into right glenohumeral joint.  Patient consents to the procedure in written form and risks were discussed, site was cleaned with alcohol and ultrasound guidance was used.

## 2019-02-01 NOTE — Progress Notes (Signed)
    Subjective:  Paula Aguilar is a 63 y.o. female who presents to the [facility] today with a chief complaint of right shoudler pain.   HPI: Adhesive capsulitis Patient comes in for follow-up for adhesive capsulitis, we had started a serial program of large dose glenohumeral joint injections.  After the last one approximately 3 weeks ago she did have reported improvement to her pain and range of motion.  Has been doing her rehab exercises approximately 3 to 4 days/week.  Feels over the last week that her range of motion has restricted slightly again and pain is started to increase.  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  Objective:  Physical Exam: BP 120/70   Ht 5\' 4"  (1.626 m)   Wt 165 lb (74.8 kg)   BMI 28.32 kg/m   Gen: NAD, pleasant  Pulm: NWOB, no cough MSK: no edema, cyanosis, or clubbing noted.  No skin changes or edema to shoulder.  On right shoulder limited abduction to approximately 90 degrees, limited flexion to approximately 120, pain with external rotation resisted and range of motion minimally reduced.  Reduced internal rotation of shoulder although still improved from last visit.  No distal neurological impact. Skin: warm, dry Neuro: grossly normal, moves all extremities Psych: Normal affect and thought content  No results found for this or any previous visit (from the past 72 hour(s)).   Assessment/Plan:  Adhesive capsulitis Patient comes in for follow-up for adhesive capsulitis, we had started a serial program of large dose glenohumeral joint injections.  After the last one approximately 3 weeks ago she did have reported improvement to her pain and range of motion.  Has been doing her rehab exercises approximately 3 to 4 days/week.  Feels over the last week that her range of motion has restricted slightly again and pain is started to increase.  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  Right shoulder range of motion is improved from last visit on exam, still limited slightly in abduction to approximately 90 degrees, flexion to approximate 120 degrees.  Slightly limited range of motion to external rotation and pain with active testing.  Still limited but improved from last visit internal rotation.  1:8 ratio injection of methyl Pred and bupivacaine using posterior approach into right glenohumeral joint.  Patient consents to the procedure in written form and risks were discussed, site was cleaned with alcohol and ultrasound guidance was used.   Sherene Sires, DO FAMILY MEDICINE RESIDENT - PGY3 02/01/2019 10:08 AM

## 2019-02-04 NOTE — Progress Notes (Signed)
[place] Attending Note: I have seen and examined this patient. I have discussed this patient with the resident and reviewed the assessment and plan as documented above. I agree with the resident's findings and plan. She has adhesive capsulitis on the right.  She is right-hand dominant.  At last office visit we did a glenohumeral joint injection.  After that she had significant as in 50% improvement in her range of motion.  Unfortunately that only lasted until last week.  Today we will do second glenohumeral joint injection and follow-up in 3 to 4 weeks.  She is doing rehab at home 3 to 5 days a week.  She also continues in her full-time job. PROCEDURE: INJECTION: I performed the procedure Patient was given informed consent, signed copy in the chart. Appropriate time out was taken. Area prepped and draped in usual sterile fashion. Ethyl chloride was  used for local anesthesia. A 21 gauge 1 1/2 inch needle was used.. 1 cc of methylprednisolone 40 mg/ml plus 8 cc of 1% bupivacaine without epinephrine was injected into the glenohumeral joint using a(n) ultrasound-guided approach.   The patient tolerated the procedure well. There were no complications. Post procedure instructions were given.

## 2019-02-13 ENCOUNTER — Other Ambulatory Visit: Payer: Self-pay | Admitting: *Deleted

## 2019-02-13 MED ORDER — PREMPRO 0.3-1.5 MG PO TABS: 1.0000 | ORAL_TABLET | Freq: Every day | ORAL | 3 refills | Status: DC

## 2019-02-22 DIAGNOSIS — C4491 Basal cell carcinoma of skin, unspecified: Secondary | ICD-10-CM

## 2019-02-22 HISTORY — DX: Basal cell carcinoma of skin, unspecified: C44.91

## 2019-03-06 ENCOUNTER — Other Ambulatory Visit: Payer: Self-pay | Admitting: *Deleted

## 2019-03-08 MED ORDER — CITALOPRAM HYDROBROMIDE 20 MG PO TABS: 20.0000 mg | ORAL_TABLET | Freq: Every day | ORAL | 3 refills | Status: DC

## 2019-03-08 NOTE — Telephone Encounter (Signed)
2nd request. Topher Buenaventura,CMA  

## 2019-03-24 ENCOUNTER — Other Ambulatory Visit: Payer: Self-pay | Admitting: Physician Assistant

## 2019-04-29 ENCOUNTER — Other Ambulatory Visit: Payer: Self-pay | Admitting: Family Medicine

## 2019-04-29 DIAGNOSIS — M4722 Other spondylosis with radiculopathy, cervical region: Secondary | ICD-10-CM

## 2019-05-31 ENCOUNTER — Other Ambulatory Visit: Payer: Self-pay

## 2019-05-31 ENCOUNTER — Other Ambulatory Visit: Payer: Self-pay | Admitting: Family Medicine

## 2019-05-31 ENCOUNTER — Ambulatory Visit (INDEPENDENT_AMBULATORY_CARE_PROVIDER_SITE_OTHER): Payer: 59 | Admitting: Family Medicine

## 2019-05-31 ENCOUNTER — Encounter: Payer: Self-pay | Admitting: Family Medicine

## 2019-05-31 VITALS — BP 114/64 | Ht 64.0 in | Wt 160.0 lb

## 2019-05-31 DIAGNOSIS — J45909 Unspecified asthma, uncomplicated: Secondary | ICD-10-CM

## 2019-05-31 DIAGNOSIS — J301 Allergic rhinitis due to pollen: Secondary | ICD-10-CM

## 2019-05-31 DIAGNOSIS — M19049 Primary osteoarthritis, unspecified hand: Secondary | ICD-10-CM | POA: Diagnosis not present

## 2019-05-31 DIAGNOSIS — M67911 Unspecified disorder of synovium and tendon, right shoulder: Secondary | ICD-10-CM | POA: Diagnosis not present

## 2019-05-31 DIAGNOSIS — M19042 Primary osteoarthritis, left hand: Secondary | ICD-10-CM | POA: Diagnosis not present

## 2019-05-31 MED ORDER — METHYLPREDNISOLONE ACETATE 40 MG/ML IJ SUSP
40.0000 mg | Freq: Once | INTRAMUSCULAR | Status: AC
  Administered 2019-05-31: 40 mg via INTRA_ARTICULAR

## 2019-05-31 MED ORDER — ALBUTEROL SULFATE (2.5 MG/3ML) 0.083% IN NEBU: 2.5000 mg | INHALATION_SOLUTION | Freq: Four times a day (QID) | RESPIRATORY_TRACT | 12 refills | Status: DC | PRN

## 2019-05-31 MED ORDER — BUDESONIDE-FORMOTEROL FUMARATE 80-4.5 MCG/ACT IN AERO: INHALATION_SPRAY | RESPIRATORY_TRACT | 12 refills | Status: AC

## 2019-05-31 NOTE — Progress Notes (Signed)
[place] Attending Note: I have seen and examined this patient. I have discussed this patient with the resident and reviewed the assessment and plan as documented above. I agree with the resident's findings and plan. I injected her MCP and supervised injection of her subacromial bursa by Dr. Shan Levans.

## 2019-05-31 NOTE — Assessment & Plan Note (Addendum)
A nebulizer machine and Symbicort inhaler were ordered for the patient, and patient was instructed to take Symbicort daily as well as her albuterol inhaler and nebulizer as needed.

## 2019-05-31 NOTE — Patient Instructions (Signed)
I have sent in an additional inhaler. Use it EVERY DAY regardless of how you are doing. Continue to use your albuterol for breakthrough symptoms.  I will have my nurse set you up for a nebulizer Let me see you in 4-6 weeks, sooner with problems.

## 2019-05-31 NOTE — Assessment & Plan Note (Signed)
Patient was advised to take Zyrtec twice a day as well as Singulair once daily.

## 2019-05-31 NOTE — Assessment & Plan Note (Signed)
Patient appears to no longer have adhesive capsulitis since her range of motion is good.  However, with positive Hawkins and empty can testing, she likely has rotator cuff tendinopathy with impingement.  A subacromial injection was given today, see procedure note.  She was also encouraged to continue with the home exercises provided previously.

## 2019-05-31 NOTE — Progress Notes (Signed)
SUBJECTIVE:   CHIEF COMPLAINT / HPI:   Right shoulder pain Paula Aguilar reports that her right shoulder has had improved range of motion and less pain since her 2 glenohumeral steroid injections in late 2020, helped although over the last 2 to 3 weeks, some of her pain has returned.  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  It is also somewhat alleviated by ibuprofen.  She would like another shot today if that is possible.  Left MCP joint pain Patient notes swelling and pain of her left MCP joint of the index finger.  She denies any trauma to the area, but says that she has had similar difficulties with her other MCP joints, and she has been told that she has arthritis there.  She has had injections and other MCP joints, and these injections seem to be helpful.  She would like an injection in this joint today if possible.  Worsening asthma and allergies Patient reports that she has had increased shortness of breath and allergy symptoms due to pollen over the last few weeks.  She has been taking Singulair and Zyrtec daily, and she has been also taking Advair once daily and albuterol as needed.  PERTINENT  PMH / PSH: Adhesive capsulitis, hand arthritis, asthma, sinusitis  OBJECTIVE:   BP 114/64   Ht 5\' 4"  (1.626 m)   Wt 160 lb (72.6 kg)   BMI 27.46 kg/m   General: well appearing, appears stated age, pleasant Respiratory: Mildly increased work of breathing at rest Shoulder, right:  No evidence of bony deformity, asymmetry, or muscle atrophy; Mild tenderness over long head of biceps (bicipital groove). No TTP at [facility] joint.  Mild tenderness along the trapezius.  Full active and passive range of motion (180 flex Huel Cote /150Abd /90ER /70IR), although there is some pain on elevation, Thumb to T12 without significant tenderness. Strength 5/5 throughout. No abnormal scapular function observed. Sensation intact. Peripheral pulses  intact.  Special Tests:   - Empty can: POS   - Hawkins: POS   - Neer test: NEG   - Yergason's: NEG   - Speeds test: POS Left hand: Mild swelling but no erythema of the MCP joint of the index finger.  Normal ROM of the wrist and fingers and 5/5 strength on finger abduction, opposition, wrist flexion and extension.  ASSESSMENT/PLAN:   Hand arthritis Corticosteroid injection provided today, see procedure note.  Tendinopathy of right rotator cuff Patient appears to no longer have adhesive capsulitis since her range of motion is good.  However, with positive Hawkins and empty can testing, she likely has rotator cuff tendinopathy with impingement.  A subacromial injection was given today, see procedure note.  She was also encouraged to continue with the home exercises provided previously.  Asthma A nebulizer machine and Symbicort inhaler were ordered for the patient, and patient was instructed to take Symbicort daily as well as her albuterol inhaler and nebulizer as needed.  Allergic rhinitis Patient was advised to take Zyrtec twice a day as well as Singulair once daily.   Procedure performed: subacromial corticosteroid injection; palpation guided Consent obtained and verified. Time-out conducted. Noted no overlying erythema, induration, or other signs of local infection. The right posterior subacromial space was palpated and marked. The overlying skin was prepped in a sterile fashion. Topical analgesic spray: Ethyl chloride. Joint: right subacromial Needle: 21GA, 1.5" Completed without difficulty. Meds: 4:1 lidocaine to depomedrol  Procedure performed: left second  metacarpophalangeal; palpation guided Consent obtained and verified. Time-out conducted. Noted no overlying erythema, induration, or other signs of local infection. The left second metacarpophalangeal joint was palpated and marked. The overlying skin was prepped in a sterile fashion. Topical analgesic spray: Ethyl  chloride. Joint: left second metacarpal joint Needle: 21GA, 1.5" Completed without difficulty. Meds: 1:1 lidocaine to depomedrol    Paula Alu, MD [place]

## 2019-05-31 NOTE — Assessment & Plan Note (Signed)
Corticosteroid injection provided today, see procedure note.

## 2019-05-31 NOTE — Progress Notes (Signed)
Neb machine orders

## 2019-06-04 ENCOUNTER — Telehealth: Payer: Self-pay

## 2019-06-04 NOTE — Telephone Encounter (Signed)
DME nebulizer machine ordered for patient. Community message send to [employer] team.

## 2019-06-04 NOTE — Telephone Encounter (Signed)
Paula Aguilar, Paula Aguilar, Paula Aguilar   Got it, thanks

## 2019-06-21 ENCOUNTER — Other Ambulatory Visit: Payer: Self-pay | Admitting: Family Medicine

## 2019-06-21 DIAGNOSIS — I10 Essential (primary) hypertension: Secondary | ICD-10-CM

## 2019-06-24 ENCOUNTER — Other Ambulatory Visit: Payer: Self-pay | Admitting: Physician Assistant

## 2019-06-26 IMAGING — DX DG ABDOMEN 2V
2 series · 2 of 2 positions shown · non-contrast
Comparison: None.

CLINICAL DATA: Lower abdominal pain for 3 weeks

EXAM:
ABDOMEN - 2 VIEW

[dg abd 2 views (1 of 2)]
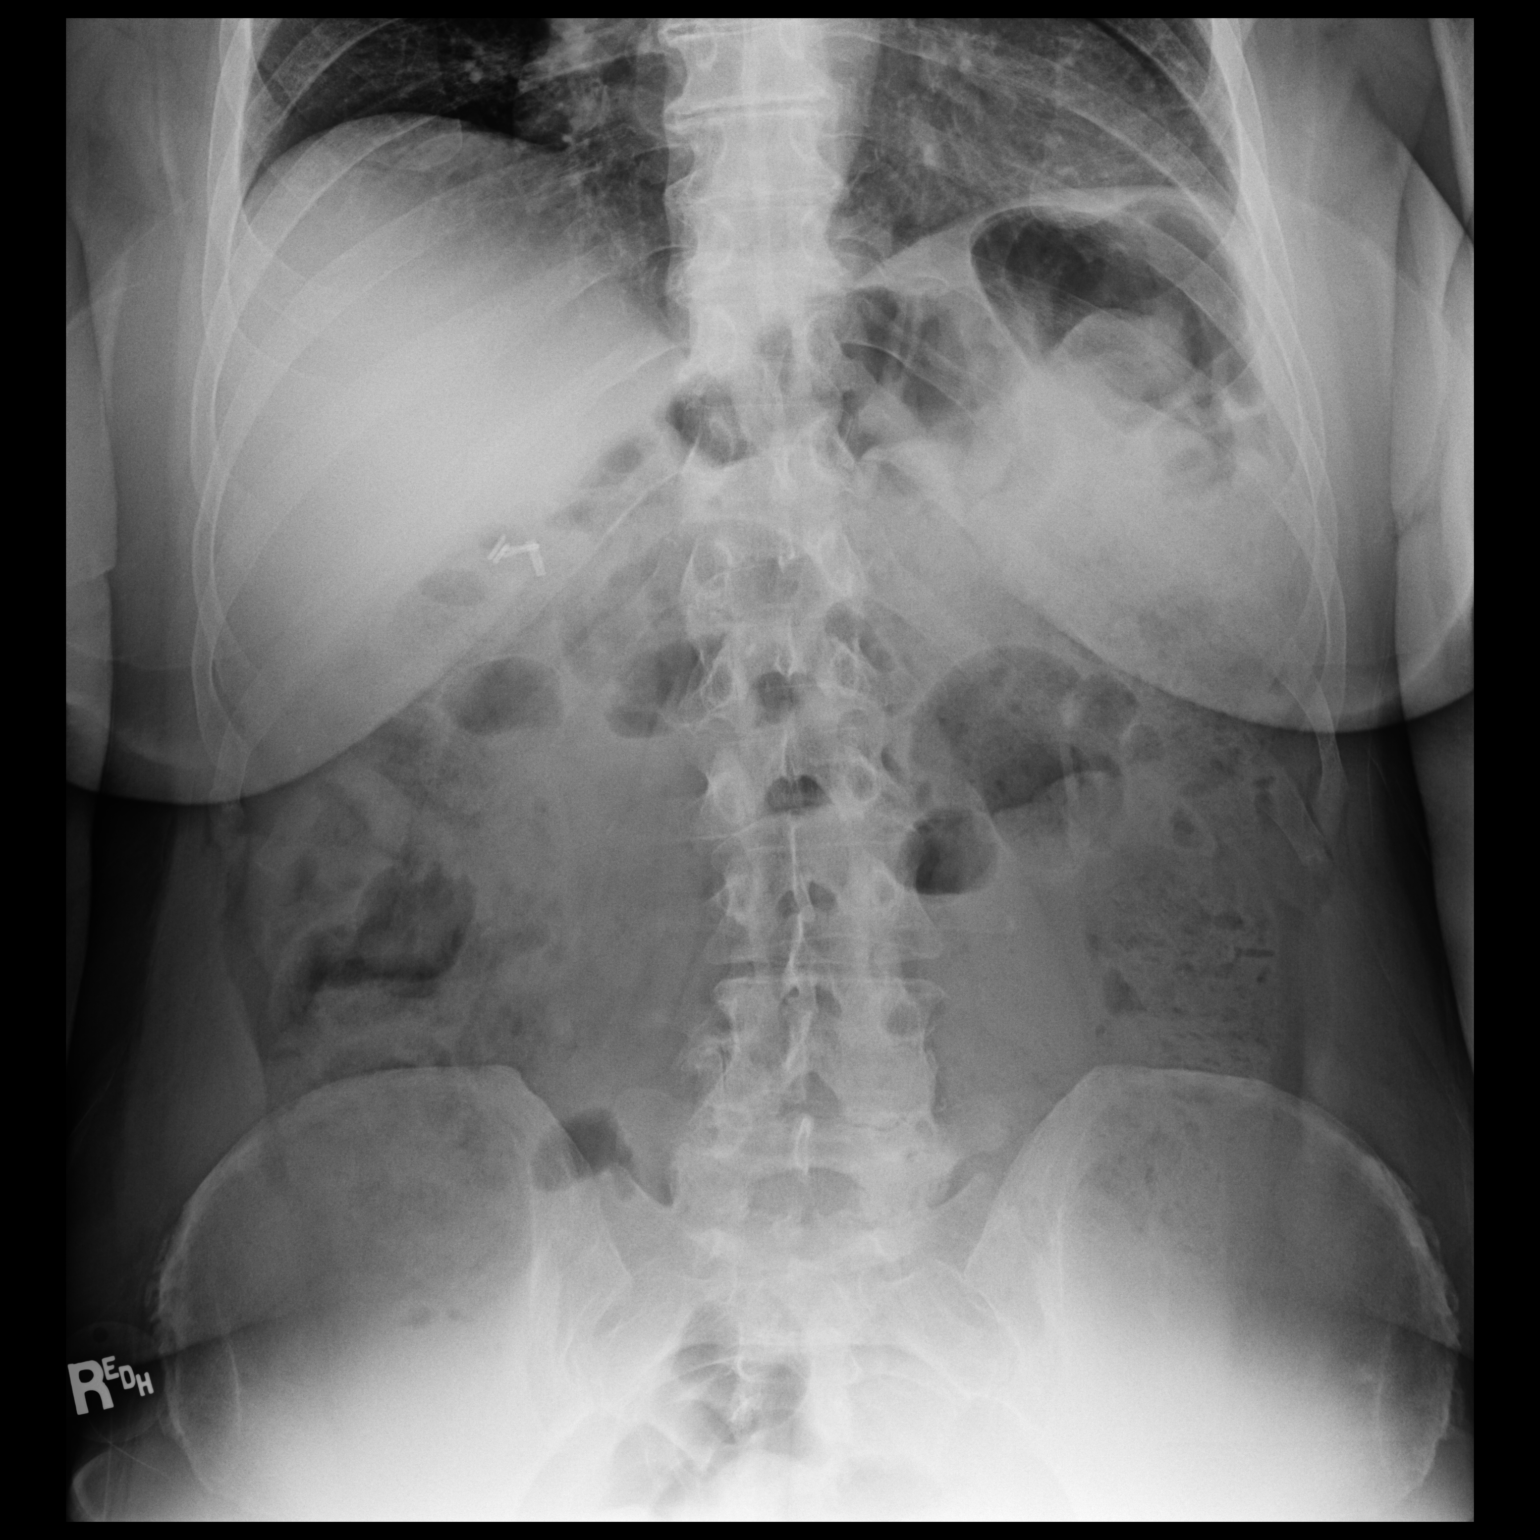

[dg abd 2 views (2 of 2)]
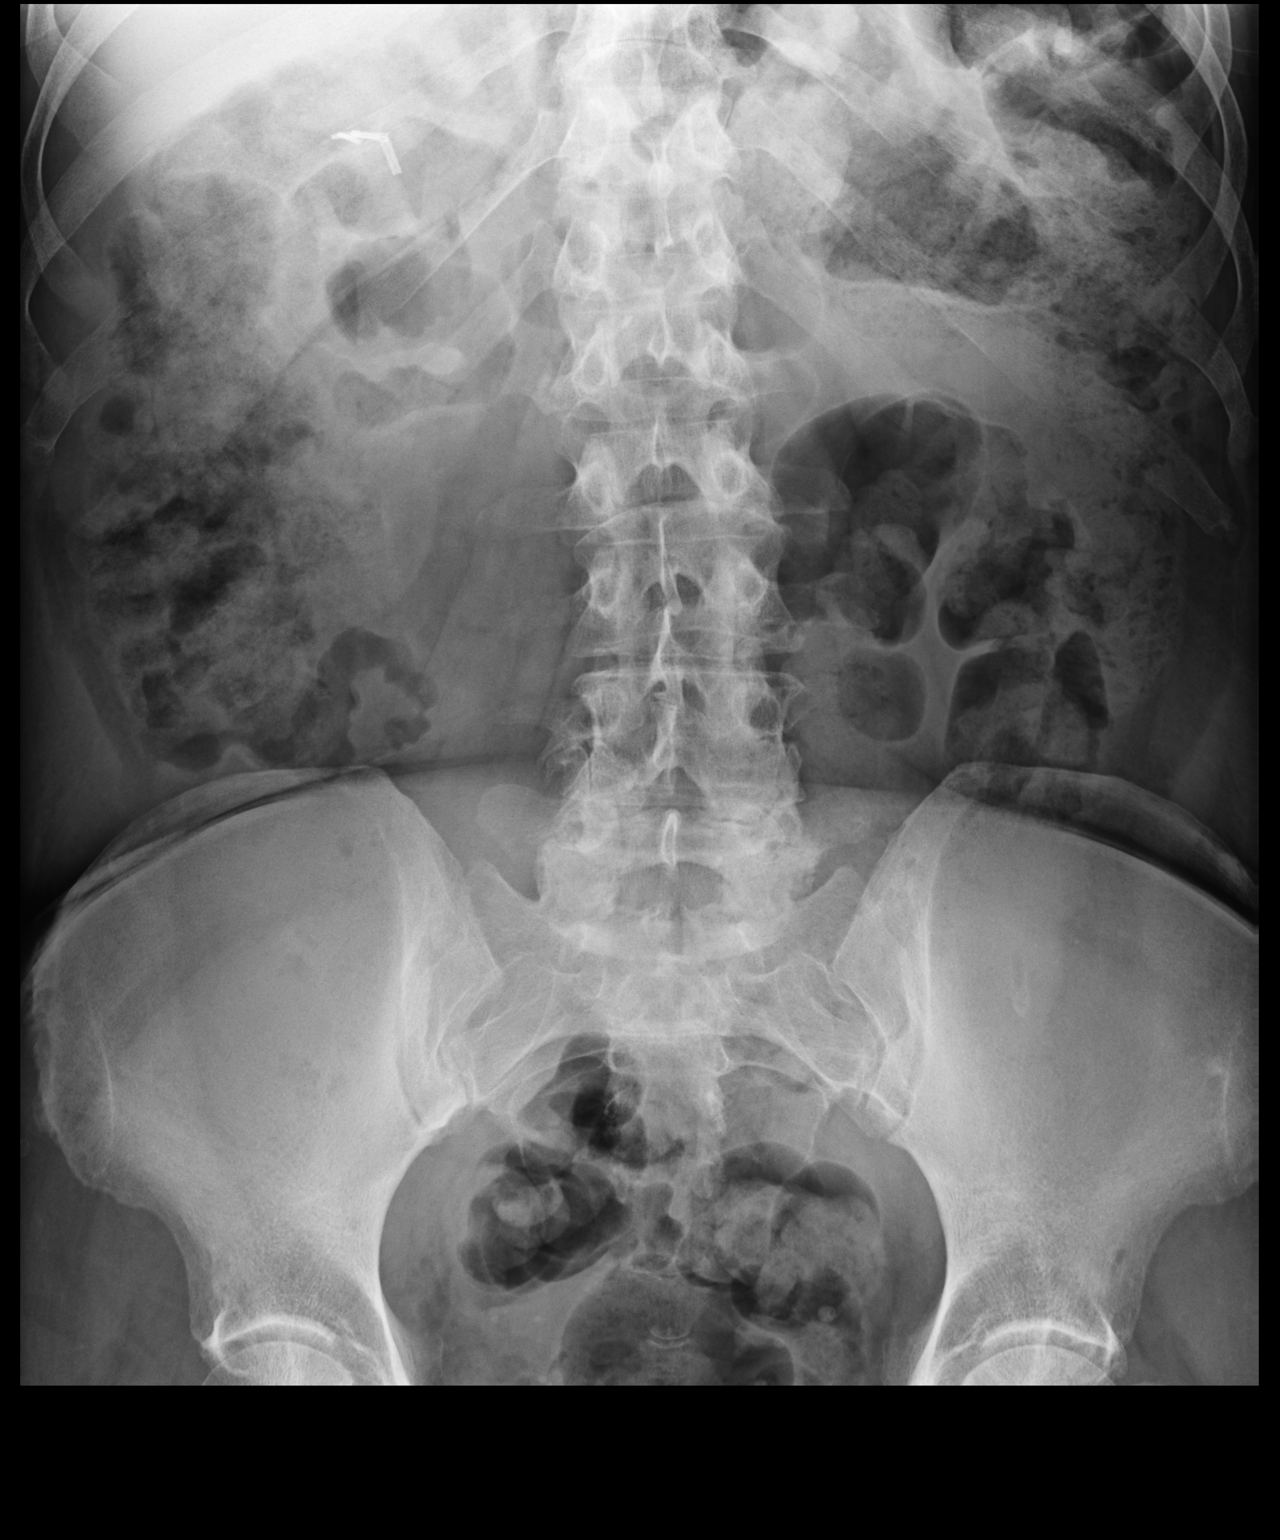

[2 of 2 positions shown; findings below may reference images not displayed]

FINDINGS: There is no bowel dilatation to suggest obstruction. There are no
air-fluid levels. There is a large amount of stool throughout the
colon. There is no evidence of pneumoperitoneum, portal venous gas,
or pneumatosis. There are no pathologic calcifications along the
expected course of the ureters.

There is lower lumbar spine spondylosis.
IMPRESSION: Large amount of stool throughout the colon.

## 2019-06-27 ENCOUNTER — Other Ambulatory Visit: Payer: Self-pay | Admitting: Physician Assistant

## 2019-07-03 ENCOUNTER — Ambulatory Visit
Admission: RE | Admit: 2019-07-03 | Discharge: 2019-07-03 | Disposition: A | Discharge status: Home / Self Care | Payer: 59 | Source: Ambulatory Visit | Attending: Family Medicine | Admitting: Family Medicine

## 2019-07-03 ENCOUNTER — Encounter: Payer: Self-pay | Admitting: Family Medicine

## 2019-07-03 ENCOUNTER — Other Ambulatory Visit: Payer: Self-pay

## 2019-07-03 ENCOUNTER — Ambulatory Visit (INDEPENDENT_AMBULATORY_CARE_PROVIDER_SITE_OTHER): Payer: 59 | Admitting: Family Medicine

## 2019-07-03 VITALS — BP 102/74 | HR 78 | Ht 64.0 in | Wt 170.2 lb

## 2019-07-03 DIAGNOSIS — F4323 Adjustment disorder with mixed anxiety and depressed mood: Secondary | ICD-10-CM

## 2019-07-03 DIAGNOSIS — K5909 Other constipation: Secondary | ICD-10-CM

## 2019-07-03 DIAGNOSIS — M7501 Adhesive capsulitis of right shoulder: Secondary | ICD-10-CM

## 2019-07-03 MED ORDER — MELOXICAM 15 MG PO TABS: 15.0000 mg | ORAL_TABLET | Freq: Every day | ORAL | 3 refills | Status: DC

## 2019-07-03 MED ORDER — LINACLOTIDE 145 MCG PO CAPS: 145.0000 ug | ORAL_CAPSULE | Freq: Every day | ORAL | 3 refills | Status: DC

## 2019-07-03 MED ORDER — CITALOPRAM HYDROBROMIDE 10 MG PO TABS: 10.0000 mg | ORAL_TABLET | Freq: Every day | ORAL | 1 refills | Status: DC

## 2019-07-03 NOTE — Patient Instructions (Signed)
I sent in orders for X Rays. I refilled your meds. We are increasing your citalopram to one 10 mg and one 20 mg a day for a total of 30 mg a day. If this is too much and you feel "zombie".. go back to the 20. Please make an appointment at Sports medicine and I will re-inject your shoulder.

## 2019-07-05 DIAGNOSIS — K5909 Other constipation: Secondary | ICD-10-CM | POA: Insufficient documentation

## 2019-07-05 NOTE — Assessment & Plan Note (Signed)
Will increase her citalopram to 30 mg.  This will require 2 prescriptions.  She did not feel quite as good if she wanted on the 20 mg with the 40 mg made her a little bit zombielike so we will try the intermediate.  Follow-up 4 to 6 weeks.

## 2019-07-05 NOTE — Progress Notes (Signed)
    CHIEF COMPLAINT / HPI: #1.  Follow-up citalopram initiation.  Feels better on it but still having a lot of anxiety.  Notices that she will be quite tense even sitting watching relaxing TV.  No bad side effects. 2.  Needs refill on her Linzess.  It has really helped her constipation.  Was initiated by her gastroenterologist.  She has been on it about a year now. 3.  Right shoulder pain got some better after the glenohumeral joint injection but the most recent subacromial bursa injection did not help.  She is having a lot of trouble at work.  Is willing to have another injection but realizes she may be headed towards some other intervention such as surgery.   PERTINENT  PMH / PSH: I have reviewed the patient's medications, allergies, past medical and surgical history, smoking status and updated in the EMR as appropriate.   OBJECTIVE:  BP 102/74   Pulse 78   Ht 5\' 4"  (1.626 m)   Wt 170 lb 3.2 oz (77.2 kg)   SpO2 98%   BMI 29.21 kg/m    ASSESSMENT / PLAN:   ADJ DISORDER WITH MIXED ANXIETY & DEPRESSED MOOD Will increase her citalopram to 30 mg.  This will require 2 prescriptions.  She did not feel quite as good if she wanted on the 20 mg with the 40 mg made her a little bit zombielike so we will try the intermediate.  Follow-up 4 to 6 weeks.  Adhesive capsulitis She will set back up to have a collateral humeral joint injection under ultrasound with me in the near future.  We will go ahead and get x-rays.  Depending on that result in the result of the next injection, she may be looking at MRI of the shoulder as planning for possible shoulder replacement or other surgical intervention.  Chronic constipation Will refill Linzess.   [name] MD

## 2019-07-05 NOTE — Assessment & Plan Note (Signed)
She will set back up to have a collateral humeral joint injection under ultrasound with me in the near future.  We will go ahead and get x-rays.  Depending on that result in the result of the next injection, she may be looking at MRI of the shoulder as planning for possible shoulder replacement or other surgical intervention.

## 2019-07-05 NOTE — Assessment & Plan Note (Signed)
Will refill Linzess.

## 2019-07-12 ENCOUNTER — Ambulatory Visit: Payer: 59 | Admitting: Family Medicine

## 2019-07-12 ENCOUNTER — Other Ambulatory Visit: Payer: Self-pay

## 2019-07-12 ENCOUNTER — Ambulatory Visit: Payer: Self-pay

## 2019-07-12 VITALS — BP 122/82 | Ht 64.0 in | Wt 160.0 lb

## 2019-07-12 DIAGNOSIS — M7501 Adhesive capsulitis of right shoulder: Secondary | ICD-10-CM | POA: Diagnosis not present

## 2019-07-12 DIAGNOSIS — M67911 Unspecified disorder of synovium and tendon, right shoulder: Secondary | ICD-10-CM

## 2019-07-12 DIAGNOSIS — L989 Disorder of the skin and subcutaneous tissue, unspecified: Secondary | ICD-10-CM

## 2019-07-12 MED ORDER — METHYLPREDNISOLONE ACETATE 40 MG/ML IJ SUSP
40.0000 mg | Freq: Once | INTRAMUSCULAR | Status: AC
  Administered 2019-07-12: 40 mg via INTRA_ARTICULAR

## 2019-07-13 DIAGNOSIS — L989 Disorder of the skin and subcutaneous tissue, unspecified: Secondary | ICD-10-CM | POA: Insufficient documentation

## 2019-07-13 NOTE — Assessment & Plan Note (Signed)
Nonhealing lesion.  Some concern for basal cell carcinoma given the chronic nature and the slightly raised border.  I have scheduled her back in seizure clinic in 3 weeks where we will excise and or biopsy lesion.  She is amenable to this.  In the interim, we will continue to cover it.

## 2019-07-13 NOTE — Progress Notes (Addendum)
  Paula Aguilar - 64 y.o. female MRN [id number]  Date of birth: 1955/05/12    SUBJECTIVE:      Chief Complaint:/ HPI:  Follow-up right shoulder pain.  To last subacromial injection we performed did not alleviate her pain at all.  She would like to pursue another glenohumeral joint injection today.  She says those really helped for at least 3 to 4 months.  She did get her x-rays.  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  She is right-hand dominant 2.  Also follow-up of the small skin lesion I had seen at her last visit [facility] the [facility].  We had placed a Band-Aid and some antibacterial ointment on it.  She says it has not gotten any better.  It is still itchy.    OBJECTIVE: BP 122/82   Ht 5\' 4"  (1.626 m)   Wt 160 lb (72.6 kg)   BMI 27.46 kg/m   Physical Exam:  Vital signs are reviewed. GENERAL: Well-developed female no acute distress SKIN: Small area on the left anterior chest with irregular border slightly raised, open excoriated center.  4 to 5 mm in short diameter and about 6 to 7 mm in long diameter.  No surrounding erythema or edema. SHOULDERS: Right shoulder has full range of motion.  She has pain with forward flexion above 120 degrees and abduction above 90 degrees.  She also has some stiffness above 90 degrees abduction.  Internal rotation external rotation lack about 10 degrees on either side of normal. Neurovascularly she is intact distally with normal pulses and normal grip strength, normal sensation. PROCEDURE: INJECTION: Patient was given informed consent, signed copy in the chart. Appropriate time out was taken. Area prepped and draped in usual sterile fashion. Ethyl chloride was  used for local anesthesia. A 21 gauge 1 1/2 inch needle was used.. 1 cc of methylprednisolone 40 mg/ml plus 4 cc of 1% lidocaine without epinephrine was injected into the right glenohumeral joint using  a(n) ultrasound-guided approach.   The patient tolerated the procedure well. There were no complications. Post procedure instructions were given.   ASSESSMENT & PLAN:  See problem based charting & AVS for pt instructions. Skin lesion of chest wall Nonhealing lesion.  Some concern for basal cell carcinoma given the chronic nature and the slightly raised border.  I have scheduled her back in seizure clinic in 3 weeks where we will excise and or biopsy lesion.  She is amenable to this.  In the interim, we will continue to cover it.

## 2019-08-15 ENCOUNTER — Other Ambulatory Visit: Payer: Self-pay | Admitting: Family Medicine

## 2019-08-15 ENCOUNTER — Ambulatory Visit (INDEPENDENT_AMBULATORY_CARE_PROVIDER_SITE_OTHER): Payer: 59 | Admitting: Family Medicine

## 2019-08-15 ENCOUNTER — Other Ambulatory Visit: Payer: Self-pay

## 2019-08-15 VITALS — BP 108/68 | HR 72 | Wt 172.4 lb

## 2019-08-15 DIAGNOSIS — D485 Neoplasm of uncertain behavior of skin: Secondary | ICD-10-CM | POA: Diagnosis not present

## 2019-08-15 DIAGNOSIS — C4491 Basal cell carcinoma of skin, unspecified: Secondary | ICD-10-CM

## 2019-08-15 DIAGNOSIS — M25552 Pain in left hip: Secondary | ICD-10-CM | POA: Diagnosis not present

## 2019-08-15 HISTORY — DX: Basal cell carcinoma of skin, unspecified: C44.91

## 2019-08-15 NOTE — Progress Notes (Signed)
Patient here for punch biopsy of the skin lesion we have previously been following. She has continued to use antibiotic ointment and Band-Aid and it has not changed much. It itches at times. She also says the area around that is sore if she pushes on it.  Objective: Small irregular flat area on left upper chest. The surrounding border is raised. The central part of the lesion is excoriated, slightly open. The surrounding tissue is normal. I note no unusual tenderness to palpation, no nodules, no unusual skin changes. Lesion is about 8 to 10 mm in its largest diameter. PROCEDURE NOTE: Patient given informed consent, signed copy in the chart. Area prepped and draped in usual sterile fashion. 1/2 cc of 1% lidocaine with epinephrine was used for local anesthesia. A 3 a 3 mm punch biopsy was used to take sample. Minimal bleeding. Band-Aid and antibiotic ointment applied. Post procedure instructions given. I will call her next week with results. No complications and patient tolerated procedure well. ASSESSMENT: Skin lesion uncertain behavior. This may be a small basal cell carcinoma and I told her that. Plan once I get the pathology back I'll call her. If it is a basal cell, we can excise it. She is comfortable with this plan.  Additionally, she mentions some left hip pain after lifting some heavy parcels earlier this week. It is on the lateral hip. Not really sore to palpation just is painful when she lifts heavy things. She is not really tender to palpation but the area she points to is over the tensor fascia lata proximally. I suspect she has a strain here. If it doesn't get better on its own the next 2 to 3 weeks with conservative care including over-the-counter intermittent NSAIDs and heat, she'll let me know and we could consider ultrasound and/or greater trochanteric bursa injection.

## 2019-08-20 ENCOUNTER — Other Ambulatory Visit: Payer: Self-pay | Admitting: Family Medicine

## 2019-08-20 DIAGNOSIS — C4491 Basal cell carcinoma of skin, unspecified: Secondary | ICD-10-CM | POA: Insufficient documentation

## 2019-08-20 DIAGNOSIS — C44519 Basal cell carcinoma of skin of other part of trunk: Secondary | ICD-10-CM

## 2019-08-20 NOTE — Progress Notes (Signed)
Will refer to derm for additional follow up and excision  See my other chart note today

## 2019-08-20 NOTE — Progress Notes (Signed)
I spoke with Paula Aguilar about her skin biopsy which showed basal cell carcinoma of infiltrative type. The lesion is probably > 10 mm in greatest diameter, so after discussion with her we decided to try and get her in with dermatology for excision, as this is slightly higher risk for recurrence. It is a primary lesion and not on face but on anterior chest close to neck and thus a lot of sun damaged skin; infiltrative pattern. Punch biopsy positive lesions were expected. Excision would need 4-5 mm beyond visible margins, so a but bigger than I had expected. IF we cannot get her into a dermatologist in reasonable time, we can excise here and see what kind of clear margins we get, but I would prefer dermatology. [name]

## 2019-08-21 ENCOUNTER — Telehealth: Payer: Self-pay | Admitting: Family Medicine

## 2019-08-21 NOTE — Telephone Encounter (Signed)
I called Paula Aguilar and told her to expect call from Dermatology office. If she has NOTheard from them in one week, she should call me and let me know that./ [name]

## 2019-09-05 ENCOUNTER — Encounter: Payer: Self-pay | Admitting: Physician Assistant

## 2019-09-05 ENCOUNTER — Other Ambulatory Visit: Payer: Self-pay

## 2019-09-05 ENCOUNTER — Ambulatory Visit: Payer: 59 | Admitting: Physician Assistant

## 2019-09-05 DIAGNOSIS — Z1283 Encounter for screening for malignant neoplasm of skin: Secondary | ICD-10-CM

## 2019-09-05 DIAGNOSIS — C44519 Basal cell carcinoma of skin of other part of trunk: Secondary | ICD-10-CM | POA: Diagnosis not present

## 2019-09-05 NOTE — Progress Notes (Signed)
   New Patient   Subjective  Paula Aguilar is a 64 y.o. female who presents for the following: New Patient (Initial Visit) (Patient here today for treatment of BCC of left chest wall biopsy done by PCP [name], MD.  Per patient the spot had been there for about a year and it started itching.).    The following portions of the chart were reviewed this encounter and updated as appropriate:     Objective  Well appearing patient in no apparent distress; mood and affect are within normal limits.  A focused examination was performed including face, neck, chest and back. Relevant physical exam findings are noted in the Assessment and Plan.  Objective  left chest: 1.3cm pearly papule with arborizing vessels.  Left margin stained Prev [id number] 3-0 vicryl x 4 ,  4-0 ethilon x 5 4.2cm       Assessment & Plan  Screening exam for skin cancer Head - Anterior (Face)  BCC (basal cell carcinoma), chest left chest  Skin excision  Lesion length (cm):  4.2 Lesion width (cm):  0.5 Margin per side (cm):  0.2 Total excision diameter (cm):  4.6 Informed consent: discussed and consent obtained   Timeout: patient name, date of birth, surgical site, and procedure verified   Anesthesia: the lesion was anesthetized in a standard fashion   Anesthetic:  1% lidocaine w/ epinephrine 1-100,000 local infiltration Instrument used: #15 blade   Hemostasis achieved with: suture and pressure   Outcome: patient tolerated procedure well with no complications   Post-procedure details: sterile dressing applied and wound care instructions given   Dressing type: bandage, petrolatum and pressure dressing    Skin repair Complexity:  Intermediate Final length (cm):  4.6 Informed consent: discussed and consent obtained   Timeout: patient name, date of birth, surgical site, and procedure verified   Procedure prep:  Patient was prepped and draped in usual sterile fashion Prep type:   Chlorhexidine Anesthesia: the lesion was anesthetized in a standard fashion   Reason for type of repair: reduce tension to allow closure, reduce the risk of dehiscence, infection, and necrosis, reduce subcutaneous dead space and avoid a hematoma, allow closure of the large defect, preserve normal anatomy, preserve normal anatomical and functional relationships and enhance both functionality and cosmetic results   Undermining: edges undermined   Subcutaneous layers (deep stitches):  Suture size:  3-0 Suture type: Vicryl (polyglactin 910)   Stitches:  Buried vertical mattress Fine/surface layer approximation (top stitches):  Suture size:  4-0 Suture type: nylon   Suture type comment:  Nylon Stitches: simple interrupted   Suture removal (days):  14 Hemostasis achieved with: suture and pressure Outcome: patient tolerated procedure well with no complications   Post-procedure details: wound care instructions given    Specimen 1 - Surgical pathology Differential Diagnosis: BCC Check Margins: No 1.3cm pearly papule with arborizing vessels.

## 2019-09-09 ENCOUNTER — Other Ambulatory Visit: Payer: Self-pay

## 2019-09-09 ENCOUNTER — Ambulatory Visit (INDEPENDENT_AMBULATORY_CARE_PROVIDER_SITE_OTHER): Payer: 59 | Admitting: Family Medicine

## 2019-09-09 VITALS — BP 98/72 | HR 81 | Wt 175.0 lb

## 2019-09-09 DIAGNOSIS — G5793 Unspecified mononeuropathy of bilateral lower limbs: Secondary | ICD-10-CM

## 2019-09-09 DIAGNOSIS — R202 Paresthesia of skin: Secondary | ICD-10-CM

## 2019-09-09 DIAGNOSIS — I10 Essential (primary) hypertension: Secondary | ICD-10-CM | POA: Diagnosis not present

## 2019-09-09 DIAGNOSIS — M7989 Other specified soft tissue disorders: Secondary | ICD-10-CM

## 2019-09-09 DIAGNOSIS — R319 Hematuria, unspecified: Secondary | ICD-10-CM

## 2019-09-09 DIAGNOSIS — Z711 Person with feared health complaint in whom no diagnosis is made: Secondary | ICD-10-CM | POA: Insufficient documentation

## 2019-09-09 DIAGNOSIS — R200 Anesthesia of skin: Secondary | ICD-10-CM

## 2019-09-09 DIAGNOSIS — G5723 Lesion of femoral nerve, bilateral lower limbs: Secondary | ICD-10-CM

## 2019-09-09 LAB — POCT URINALYSIS DIP (MANUAL ENTRY)
Bilirubin, UA: NEGATIVE
Blood, UA: NEGATIVE
Glucose, UA: NEGATIVE mg/dL
Ketones, POC UA: NEGATIVE mg/dL
Leukocytes, UA: NEGATIVE
Nitrite, UA: NEGATIVE
Protein Ur, POC: NEGATIVE mg/dL
Spec Grav, UA: >=1.03 — AB (ref 1.010–1.025)
Urobilinogen, UA: 0.2 E.U./dL
pH, UA: 5.5 (ref 5.0–8.0)

## 2019-09-09 NOTE — Progress Notes (Signed)
    SUBJECTIVE:   CHIEF COMPLAINT / HPI:   Blood in urine Stanton Kidney reports that she noticed red urine or possibly blood in her urine this past Wednesday the multiple times on this past Friday and once over the weekend.  She had not noticed any blood in her urine or red urine this morning.  She wanted to make sure this was not anything concerning and came in to be evaluated.  She denies any dysuria, polyuria, abdominal pain.  Numbness and tingling of the feet bilaterally For the past week, she has noticed very mild swelling in her feet and ankles in addition to numbness on the soles of her feet.  She occasionally noted some mild shooting pain from her toes toward her mid foot which seems to have gone away.  She notices this numbness and tingling primarily at night.  She has not had symptoms like this before.  She has no history of diabetes.  She has no history of heart failure and has not had any exertional issues or shortness of breath recently.  PERTINENT  PMH / PSH: Irritable bowel syndrome, GERD, hypertension  OBJECTIVE:   BP 98/72   Pulse 81   Wt 175 lb (79.4 kg)   SpO2 98%   BMI 30.04 kg/m    General: Alert and cooperative and appears to be in no acute distress HEENT: Neck non-tender without lymphadenopathy, masses or thyromegaly Cardio: Normal S1 and S2, no S3 or S4. Rhythm is regular. No murmurs or rubs.   Pulm: Clear to auscultation bilaterally, no crackles, wheezing, or diminished breath sounds. Normal respiratory effort Abdomen: Bowel sounds normal. Abdomen soft and non-tender.  Extremities: Trace pitting edema not quite up to the midshin bilaterally.  Symmetrical swelling.  Difficult to palpate dorsalis pedis and TP pulses.  No evidence of maceration or significant moisture in the under foot or between toes.  Negative squeeze test bilaterally.   ASSESSMENT/PLAN:   Concern about urinary tract disease without diagnosis No evidence of hematuria on dipstick today.  No microscopy  necessary.  She was informed that there is no evidence of blood in her urine today and encouraged to return to clinic if she did notice any new red or dark urine.  She was informed that her urine appears concentrated it may simply be due to dehydration and she was encouraged to drink plenty of water during the day.  Numbness and tingling of both feet The differential includes vitamin deficiency due to IBS, hypothyroidism, nerve impingement secondary to mild edema, tarsal tunnel syndrome.  Low suspicion for Morton's neuroma based on physical exam.  No evidence of athlete's foot on physical exam.  No evidence of foot drop or gross abnormal morphology.  ABIs performed due to poorly palpable distal pulses and were entirely within normal limits. -Follow-up TSH, B12, folate, CBC, BMP -If labs are unremarkable, move forward with compression socks to see if this mild edema is leading to nerve impingement. -If symptoms or not relieved by compression socks, will consider moving forward with increasing the gabapentin dose.     Matilde Haymaker, MD [place]

## 2019-09-09 NOTE — Patient Instructions (Signed)
Is a quick summary of the things we talked about today:  Urine concern: In the sample of urine we took care in clinic, there was no evidence of blood or infection.  I am not positive why you noted some red-tinged urine earlier this week, it may have been dehydration or it may have been bleeding for some unknown reason.  For now, we do not need to do anything else unless you notice red-tinged urine again.  Numbness, tingling: There are a handful of things that can cause numbness and tingling in the lower extremities.  I think it is reasonable to start with some test to see if you have any thyroid or vitamin deficiencies that would be easily correctable.  If your tests did not show anything remarkable, then I will recommend you use compression socks which you can buy online.  I recommend looking for socks that give at least 15 mmHg of compression.  I will let you know if your lab results show anything remarkable.

## 2019-09-09 NOTE — Assessment & Plan Note (Signed)
The differential includes vitamin deficiency due to IBS, hypothyroidism, nerve impingement secondary to mild edema, tarsal tunnel syndrome.  Low suspicion for Morton's neuroma based on physical exam.  No evidence of athlete's foot on physical exam.  No evidence of foot drop or gross abnormal morphology.  ABIs performed due to poorly palpable distal pulses and were entirely within normal limits. -Follow-up TSH, B12, folate, CBC, BMP -If labs are unremarkable, move forward with compression socks to see if this mild edema is leading to nerve impingement. -If symptoms or not relieved by compression socks, will consider moving forward with increasing the gabapentin dose.

## 2019-09-09 NOTE — Assessment & Plan Note (Signed)
No evidence of hematuria on dipstick today.  No microscopy necessary.  She was informed that there is no evidence of blood in her urine today and encouraged to return to clinic if she did notice any new red or dark urine.  She was informed that her urine appears concentrated it may simply be due to dehydration and she was encouraged to drink plenty of water during the day.

## 2019-09-10 LAB — CBC
Hematocrit: 43.5 % (ref 34.0–46.6)
Hemoglobin: 14.5 g/dL (ref 11.1–15.9)
MCH: 30.9 pg (ref 26.6–33.0)
MCHC: 33.3 g/dL (ref 31.5–35.7)
MCV: 93 fL (ref 79–97)
Platelets: 323 10*3/uL (ref 150–450)
RBC: 4.69 x10E6/uL (ref 3.77–5.28)
RDW: 13 % (ref 11.7–15.4)
WBC: 11.6 10*3/uL — ABNORMAL HIGH (ref 3.4–10.8)

## 2019-09-10 LAB — BASIC METABOLIC PANEL
BUN/Creatinine Ratio: 25 (ref 12–28)
BUN: 17 mg/dL (ref 8–27)
CO2: 28 mmol/L (ref 20–29)
Calcium: 8.4 mg/dL — ABNORMAL LOW (ref 8.7–10.3)
Chloride: 105 mmol/L (ref 96–106)
Creatinine, Ser: 0.69 mg/dL (ref 0.57–1.00)
GFR calc Af Amer: 108 mL/min/{1.73_m2} (ref >59)
GFR calc non Af Amer: 94 mL/min/{1.73_m2} (ref >59)
Glucose: 81 mg/dL (ref 65–99)
Potassium: 4.6 mmol/L (ref 3.5–5.2)
Sodium: 142 mmol/L (ref 134–144)

## 2019-09-10 LAB — VITAMIN B12: Vitamin B-12: 638 pg/mL (ref 232–1245)

## 2019-09-10 LAB — FOLATE: Folate: 13.1 ng/mL (ref >3.0)

## 2019-09-10 LAB — TSH: TSH: 2.27 u[IU]/mL (ref 0.450–4.500)

## 2019-09-13 ENCOUNTER — Telehealth: Payer: Self-pay

## 2019-09-13 NOTE — Telephone Encounter (Signed)
Informed patient of results and recommendations. 

## 2019-09-13 NOTE — Telephone Encounter (Signed)
-----   Message from Warren Danes, [place] sent at 09/12/2019 11:08 AM EDT ----- Inform free margins. RTC if recurs

## 2019-09-16 ENCOUNTER — Other Ambulatory Visit: Payer: Self-pay

## 2019-09-16 ENCOUNTER — Ambulatory Visit (INDEPENDENT_AMBULATORY_CARE_PROVIDER_SITE_OTHER): Payer: 59

## 2019-09-16 DIAGNOSIS — Z4802 Encounter for removal of sutures: Secondary | ICD-10-CM

## 2019-09-16 NOTE — Progress Notes (Signed)
Suture removal on the left side of the chest.

## 2019-10-08 ENCOUNTER — Other Ambulatory Visit: Payer: Self-pay

## 2019-10-08 ENCOUNTER — Ambulatory Visit: Payer: 59 | Admitting: Family Medicine

## 2019-10-08 ENCOUNTER — Ambulatory Visit (INDEPENDENT_AMBULATORY_CARE_PROVIDER_SITE_OTHER): Payer: 59 | Admitting: Family Medicine

## 2019-10-08 VITALS — BP 122/74 | HR 70

## 2019-10-08 DIAGNOSIS — M7989 Other specified soft tissue disorders: Secondary | ICD-10-CM

## 2019-10-08 DIAGNOSIS — Z23 Encounter for immunization: Secondary | ICD-10-CM | POA: Diagnosis not present

## 2019-10-08 MED ORDER — FUROSEMIDE 20 MG PO TABS: 20.0000 mg | ORAL_TABLET | Freq: Every day | ORAL | 0 refills | Status: DC

## 2019-10-08 NOTE — Patient Instructions (Signed)
It was a pleasure to meet you today!  I am sorry you are having these issues with the swelling, numbness and tingling and pain in your feet.  At this time I am unsure of the cause of the swelling but the treatment will be to try and get some fluid off of you with some medications to make you urinate.  I am prescribing Lasix 20 mg for 6 days.  We have scheduled you a follow-up appointment with Dr. Nori Riis on 8/25.  At this visit you will need lab work to check your kidney function to make sure tolerated the diuretics well.  You are getting the first dose of the Covid vaccine today.  You will need the second dose in 21 days.  If you have any questions between now and your next visit please reach out to our clinic.  I hope you have a wonderful night!  Furosemide Oral Tablets What is this medicine? FUROSEMIDE (fyoor [place] se mide) is a diuretic. It helps you make more urine and to lose salt and excess water from your body. It treats swelling from heart, kidney, or liver disease. It also treats high blood pressure. This medicine may be used for other purposes; ask your health care provider or pharmacist if you have questions. COMMON BRAND NAME(S): Active-Medicated Specimen Kit, Delone, Diuscreen, Lasix, RX Specimen Collection Kit, Specimen Collection Kit, URINX Medicated Specimen Collection What should I tell my health care provider before I take this medicine? They need to know if you have any of these conditions:  abnormal blood electrolytes  diarrhea or vomiting  gout  heart disease  kidney disease, small amounts of urine, or difficulty passing urine  liver disease  thyroid disease  an unusual or allergic reaction to furosemide, sulfa drugs, other medicines, foods, dyes, or preservatives  pregnant or trying to get pregnant  breast-feeding How should I use this medicine? Take this drug by mouth. Take it as directed on the prescription label at the same time every day. You can take it with or  without food. If it upsets your stomach, take it with food. Keep taking it unless your health care provider tells you to stop. Talk to your health care provider about the use of this drug in children. Special care may be needed. Overdosage: If you think you have taken too much of this medicine contact a poison control center or emergency room at once. NOTE: This medicine is only for you. Do not share this medicine with others. What if I miss a dose? If you miss a dose, take it as soon as you can. If it is almost time for your next dose, take only that dose. Do not take double or extra doses. What may interact with this medicine?  aspirin and aspirin-like medicines  certain antibiotics  chloral hydrate  cisplatin  cyclosporine  digoxin  diuretics  laxatives  lithium  medicines for blood pressure  medicines that relax muscles for surgery  methotrexate  NSAIDs, medicines for pain and inflammation like ibuprofen, naproxen, or indomethacin  phenytoin  steroid medicines like prednisone or cortisone  sucralfate  thyroid hormones This list may not describe all possible interactions. Give your health care provider a list of all the medicines, herbs, non-prescription drugs, or dietary supplements you use. Also tell them if you smoke, drink alcohol, or use illegal drugs. Some items may interact with your medicine. What should I watch for while using this medicine? Visit your doctor or health care provider for regular  checks on your progress. Check your blood pressure regularly. Ask your doctor or health care provider what your blood pressure should be, and when you should contact him or her. If you are a diabetic, check your blood sugar as directed. This medicine may cause serious skin reactions. They can happen weeks to months after starting the medicine. Contact your health care provider right away if you notice fevers or flu-like symptoms with a rash. The rash may be red or purple  and then turn into blisters or peeling of the skin. Or, you might notice a red rash with swelling of the face, lips or lymph nodes in your neck or under your arms. You may need to be on a special diet while taking this medicine. Check with your doctor. Also, ask how many glasses of fluid you need to drink a day. You must not get dehydrated. You may get drowsy or dizzy. Do not drive, use machinery, or do anything that needs mental alertness until you know how this drug affects you. Do not stand or sit up quickly, especially if you are an older patient. This reduces the risk of dizzy or fainting spells. Alcohol can make you more drowsy and dizzy. Avoid alcoholic drinks. This medicine can make you more sensitive to the sun. Keep out of the sun. If you cannot avoid being in the sun, wear protective clothing and use sunscreen. Do not use sun lamps or tanning beds/booths. What side effects may I notice from receiving this medicine? Side effects that you should report to your doctor or health care professional as soon as possible:  blood in urine or stools  dry mouth  fever or chills  hearing loss or ringing in the ears  irregular heartbeat  muscle pain or weakness, cramps  rash, fever, and swollen lymph nodes  redness, blistering, peeling or loosening of the skin, including inside the mouth  skin rash  stomach upset, pain, or nausea  tingling or numbness in the hands or feet  unusually weak or tired  vomiting or diarrhea  yellowing of the eyes or skin Side effects that usually do not require medical attention (report to your doctor or health care professional if they continue or are bothersome):  headache  loss of appetite  unusual bleeding or bruising This list may not describe all possible side effects. Call your doctor for medical advice about side effects. You may report side effects to FDA at 1-800-FDA-1088. Where should I keep my medicine? Keep out of the reach of children  and pets. Store at room temperature between 20 and 25 degrees C (68 and 77 degrees F). Protect from light and moisture. Keep the container tightly closed. Throw away any unused drug after the expiration date. NOTE: This sheet is a summary. It may not cover all possible information. If you have questions about this medicine, talk to your doctor, pharmacist, or health care provider.  2020 Elsevier/Gold Standard (2018-09-25 18:01:32)

## 2019-10-08 NOTE — Progress Notes (Addendum)
    SUBJECTIVE:   HPI:   Swelling She was seen on 09/09/19 for mild swelling in feet and ankles of a week duration, and numbness and tingling in soles of feet. Today, she reports significant full body swelling--notably swelling in face, neck, legs, feet, and arms since Friday (4 days ago, 10/04/19) and aching in all joints. Reports increased pain with standing. Elevation of feet provides some, though limited relief. She reports limited added salts in home cooked meals, though she does frequently eat out. She has not used compression stockings. Reports only recent medication change has been stopping estrogen some weeks ago, and starting a multivitamin two weeks ago. Has had some nausea, but no vomiting. No history of diabetes or heart failure.   Cough Patient has a chronic history of cough; and asthma diagnosed at ~ age 18. She takes lisinopril-hydrochlorothiazide for her HTN management, but this was started just a few years ago. Today she denies fever, sick contacts, and loss of smell and taste.   PERTINENT  PMH / PSH: HTN, Allergic Rhinitis, Asthma, Osteoarthritis,  Anxiety / Depression  OBJECTIVE:   BP 122/74   Pulse 70   SpO2 97%    General: No acute distress. Pleasant and cooperative. HEENT: Clear conjunctiva. Minimal swelling in face and neck (no appreciable swelling of lips or tongue).  CV: Regular rate and rhythm, no murmur Pulmonary: Normal work of breathing.Clear to auscultation bilaterally Extremities: 2+ pitting edema on bilateral lower legs  Neuro: alert and responsive, no focal deficits  ASSESSMENT/PLAN:  HM Patient counseled. She decided to get COVID vaccine (1st dose Today)   Swelling Differential diagnosis is broad and likely multifactorial, including SSRI induced SIADH, heart failure, venous insufficiency, electrolyte imbalance, malnutrition, and sequelae of high salt intake. No evidence of angioedema on exam. Unremarkable BMP at last visit re-assuring against kidney  disease.  -Lasix 20 mg for 6 days -Regular home weights -compression stockings -f/u with Dr. Nori Riis  on 8/25. Will get a BMP at that visit.    No problem-specific Assessment & Plan notes found for this encounter.     [place]   Resident Attestation  I saw and evaluated the patient, performing the key elements of the service.I  personally performed or re-performed the history, physical exam, and medical decision making activities of this service and have verified that the service and findings are accurately documented in the student's note. I developed the management plan that is described in the medical student's note, and I agree with the content, with my edits above.    Gifford Shave, PGY1

## 2019-10-09 NOTE — Progress Notes (Signed)
    SUBJECTIVE:   HPI:   Swelling She was seen on 09/09/19 for mild swelling in feet and ankles of a week duration, and numbness and tingling in soles of feet. Today, she reports significant full body swelling--notably swelling in face, neck, legs, feet, and arms since Friday (4 days ago, 10/04/19) and aching in all joints. Reports increased pain with standing. Elevation of feet provides some, though limited relief. She reports limited added salts in home cooked meals, though she does frequently eat out. She has not used compression stockings. Reports only recent medication change has been stopping estrogen some weeks ago, and starting a multivitamin two weeks ago. Has had some nausea, but no vomiting. No history of diabetes or heart failure.   Cough Patient has a chronic history of cough; and asthma diagnosed at ~ age 40. She takes lisinopril-hydrochlorothiazide for her HTN management, but this was started just a few years ago. Today she denies fever, sick contacts, and loss of smell and taste.   PERTINENT  PMH / PSH: HTN, Allergic Rhinitis, Asthma, Osteoarthritis,  Anxiety / Depression  OBJECTIVE:   BP 122/74   Pulse 70   SpO2 97%    General: No acute distress. Pleasant and cooperative. HEENT: Clear conjunctiva. Minimal swelling in face and neck (no appreciable swelling of lips or tongue).  CV: Regular rate and rhythm, no murmur Pulmonary: Normal work of breathing.Clear to auscultation bilaterally, no crackles appreciated at lung bases. No wheeze noted  Extremities: 2+ pitting edema on bilateral lower legs  Neuro: alert and responsive, no focal deficits  ASSESSMENT/PLAN:  HM Patient counseled. She decided to get COVID vaccine (1st dose Today)   Swelling Differential diagnosis is broad and likely multifactorial, including SSRI induced SIADH, heart failure, venous insufficiency, electrolyte imbalance, malnutrition, and sequelae of high salt intake. No evidence of angioedema  on exam. Unremarkable BMP at last visit re-assuring against kidney disease.  -Lasix 20 mg for 6 days -Regular home weights -compression stockings -f/u with Dr. Nori Riis  on 8/25. Will get a BMP at that visit.    No problem-specific Assessment & Plan notes found for this encounter.    [place]  Resident Attestation  I saw and evaluated the patient, performing the key elements of the service.I  personally performed or re-performed the history, physical exam, and medical decision making activities of this service and have verified that the service and findings are accurately documented in the student's note. I developed the management plan that is described in the medical student's note, and I agree with the content, with my edits above.    Gifford Shave, PGY2

## 2019-10-11 DIAGNOSIS — M7989 Other specified soft tissue disorders: Secondary | ICD-10-CM | POA: Insufficient documentation

## 2019-10-16 ENCOUNTER — Ambulatory Visit: Payer: 59 | Admitting: Family Medicine

## 2019-10-16 ENCOUNTER — Encounter: Payer: Self-pay | Admitting: Family Medicine

## 2019-10-16 ENCOUNTER — Other Ambulatory Visit: Payer: Self-pay

## 2019-10-16 VITALS — BP 124/88 | HR 81 | Ht 64.0 in | Wt 173.8 lb

## 2019-10-16 DIAGNOSIS — Z711 Person with feared health complaint in whom no diagnosis is made: Secondary | ICD-10-CM | POA: Diagnosis not present

## 2019-10-16 DIAGNOSIS — R609 Edema, unspecified: Secondary | ICD-10-CM | POA: Diagnosis not present

## 2019-10-16 DIAGNOSIS — I10 Essential (primary) hypertension: Secondary | ICD-10-CM | POA: Diagnosis not present

## 2019-10-16 DIAGNOSIS — K5909 Other constipation: Secondary | ICD-10-CM

## 2019-10-16 DIAGNOSIS — M7989 Other specified soft tissue disorders: Secondary | ICD-10-CM

## 2019-10-16 DIAGNOSIS — C44519 Basal cell carcinoma of skin of other part of trunk: Secondary | ICD-10-CM

## 2019-10-16 MED ORDER — FUROSEMIDE 20 MG PO TABS: ORAL_TABLET | ORAL | 3 refills | Status: DC

## 2019-10-16 NOTE — Progress Notes (Signed)
    CHIEF COMPLAINT / HPI:  f/u lower extremity edema. We started her on lasix. Edema much better. No new side effects. Legs feel less tight, no more paresthesias and improved joint pains in her lower extremity.  Discuss her episode of hematuria. Recap; she had 1-2 days of red colored urine, pretty dark.Painless. Was seen here 09/09/19 and had work up. UA  dispstick was negative  for hgb then but was high spec. Grav. She has had no recurrence of blood or dark or pink tinged urine.  COVID vaccine at last office visit. Had sore arm but otherwise  Was Ok. She wants to discuss but is likely ready to get second dose soon.  F/u skin lesion. Went to derm and had it removed. No problems. Has f/u with hthem in a week   PERTINENT  PMH / PSH: I have reviewed the patient's medications, allergies, past medical and surgical history, smoking status and updated in the EMR as appropriate.   OBJECTIVE:  BP 124/88   Pulse 81   Ht 5\' 4"  (1.626 m)   Wt 173 lb 12.8 oz (78.8 kg)   SpO2 97%   BMI 29.83 kg/m    ASSESSMENT / PLAN:   Concern about urinary tract disease without diagnosis Discussed her episode of what may have been heamturia. Kidney function and urine disptick negative. No recurrence. Will follow and if she has ANY recurrence of urinary changes would consider continued work up to include serial UA, CT renal stone study, possible urology referral. She is aware and will keep me posted.  Chronic constipation We discussed chronic Linzess and will continue for now as it is helping significantly  Basal cell carcinoma Incision well healing Reviewed chart and margins free  Swelling of lower extremity Will rechelk labs today as she has been on lasix one week. SSeems to be helping. She may not need it every day but we can continue prn.  Re her etiology; unkown. Likely multifactorial including dependent edema, maybe some contribution from gabapentin(?) altho temporally that does not seem to be  related. UA shows no protein,normal creatinine, normal heart exam and no cardiac symptoms. Recheck labs today.   [name] MD

## 2019-10-16 NOTE — Assessment & Plan Note (Signed)
Discussed her episode of what may have been heamturia. Kidney function and urine disptick negative. No recurrence. Will follow and if she has ANY recurrence of urinary changes would consider continued work up to include serial UA, CT renal stone study, possible urology referral. She is aware and will keep me posted.

## 2019-10-16 NOTE — Assessment & Plan Note (Signed)
We discussed chronic Linzess and will continue for now as it is helping significantly

## 2019-10-16 NOTE — Assessment & Plan Note (Addendum)
Will rechelk labs today as she has been on lasix one week. SSeems to be helping. She may not need it every day but we can continue prn.  Re her etiology; unkown. Likely multifactorial including dependent edema, maybe some contribution from gabapentin(?) altho temporally that does not seem to be related. UA shows no protein,normal creatinine, normal heart exam and no cardiac symptoms. Recheck labs today.

## 2019-10-16 NOTE — Assessment & Plan Note (Signed)
Incision well healing Reviewed chart and margins free

## 2019-10-17 LAB — BASIC METABOLIC PANEL
BUN/Creatinine Ratio: 27 (ref 12–28)
BUN: 21 mg/dL (ref 8–27)
CO2: 27 mmol/L (ref 20–29)
Calcium: 8.9 mg/dL (ref 8.7–10.3)
Chloride: 107 mmol/L — ABNORMAL HIGH (ref 96–106)
Creatinine, Ser: 0.77 mg/dL (ref 0.57–1.00)
GFR calc Af Amer: 95 mL/min/{1.73_m2} (ref >59)
GFR calc non Af Amer: 82 mL/min/{1.73_m2} (ref >59)
Glucose: 73 mg/dL (ref 65–99)
Potassium: 4.2 mmol/L (ref 3.5–5.2)
Sodium: 143 mmol/L (ref 134–144)

## 2019-10-17 LAB — CBC
Hematocrit: 43.9 % (ref 34.0–46.6)
Hemoglobin: 14.6 g/dL (ref 11.1–15.9)
MCH: 30.3 pg (ref 26.6–33.0)
MCHC: 33.3 g/dL (ref 31.5–35.7)
MCV: 91 fL (ref 79–97)
Platelets: 370 10*3/uL (ref 150–450)
RBC: 4.82 x10E6/uL (ref 3.77–5.28)
RDW: 12.2 % (ref 11.7–15.4)
WBC: 12.5 10*3/uL — ABNORMAL HIGH (ref 3.4–10.8)

## 2019-10-21 ENCOUNTER — Encounter: Payer: Self-pay | Admitting: Family Medicine

## 2019-10-21 DIAGNOSIS — D729 Disorder of white blood cells, unspecified: Secondary | ICD-10-CM

## 2019-10-21 NOTE — Progress Notes (Signed)
Recheck WBC in one month

## 2019-10-29 ENCOUNTER — Ambulatory Visit: Payer: 59

## 2019-10-30 ENCOUNTER — Ambulatory Visit (INDEPENDENT_AMBULATORY_CARE_PROVIDER_SITE_OTHER): Payer: 59

## 2019-10-30 ENCOUNTER — Other Ambulatory Visit: Payer: Self-pay

## 2019-10-30 DIAGNOSIS — Z23 Encounter for immunization: Secondary | ICD-10-CM | POA: Diagnosis not present

## 2019-10-30 NOTE — Progress Notes (Signed)
   Covid-19 Vaccination Clinic  Name:  Paula Aguilar    MRN: [id number] DOB: [birth year]  10/30/2019  Patient presents to nurse clinic for second [place] vaccination. Patient denies previous allergic reaction to vaccine and answers no to all screening questions. Administered in LD, site unremarkable, tolerated injection well.  [name] was observed post Covid-19 immunization for 15 minutes without incident. She was provided with Vaccine Information Sheet and instruction to access the V-Safe system.   [name] was instructed to call 911 with any severe reactions post vaccine: Marland Kitchen Difficulty breathing  . Swelling of face and throat  . A fast heartbeat  . A bad rash all over body  . Dizziness and weakness    Patient provided with updated immunization card. [facility] is currently down, will update state record once website is up.   Talbot Grumbling, RN

## 2019-11-14 ENCOUNTER — Other Ambulatory Visit: Payer: Self-pay

## 2019-11-14 ENCOUNTER — Ambulatory Visit (INDEPENDENT_AMBULATORY_CARE_PROVIDER_SITE_OTHER): Payer: 59 | Admitting: Physician Assistant

## 2019-11-14 ENCOUNTER — Encounter: Payer: Self-pay | Admitting: Physician Assistant

## 2019-11-14 DIAGNOSIS — Z85828 Personal history of other malignant neoplasm of skin: Secondary | ICD-10-CM

## 2019-11-14 DIAGNOSIS — Z1283 Encounter for screening for malignant neoplasm of skin: Secondary | ICD-10-CM

## 2019-11-14 DIAGNOSIS — L570 Actinic keratosis: Secondary | ICD-10-CM

## 2019-11-14 DIAGNOSIS — C4441 Basal cell carcinoma of skin of scalp and neck: Secondary | ICD-10-CM

## 2019-11-14 NOTE — Patient Instructions (Signed)

## 2019-11-14 NOTE — Progress Notes (Addendum)
   Follow-Up Visit   Subjective  Paula KARWOWSKI is a 64 y.o. female who presents for the following: Skin Problem (Here for possible biopsy on forehead and check a dry area around the mouth. ).   The following portions of the chart were reviewed this encounter and updated as appropriate: Tobacco  Allergies  Meds  Problems  Med Hx  Surg Hx  Fam Hx      Objective  Well appearing patient in no apparent distress; mood and affect are within normal limits.  All skin waist up examined.  Objective  Right Zygomatic Area: Erythematous patches with gritty scale.  Objective  Left Breast: Excision-- scar clear  Objective  Head - to toe: No atypical nevi No signs of non-mole skin cancer.   Objective  Left Frontal Scalp: Pearly papule with telangectasia.      Assessment & Plan  AK (actinic keratosis) Right Zygomatic Area  Destruction of lesion - Right Zygomatic Area Complexity: simple   Destruction method: cryotherapy   Informed consent: discussed and consent obtained   Timeout:  patient name, date of birth, surgical site, and procedure verified Lesion destroyed using liquid nitrogen: Yes   Outcome: patient tolerated procedure well with no complications    History of basal cell carcinoma (BCC) of skin Left Breast  observe  Screening exam for skin cancer Head - to toe  Yearly skin exam  BCC (basal cell carcinoma), scalp/neck Left Frontal Scalp  Skin / nail biopsy Type of biopsy: punch   Informed consent: discussed and consent obtained   Timeout: patient name, date of birth, surgical site, and procedure verified   Procedure prep:  Patient was prepped and draped in usual sterile fashion (Non sterile) Prep type:  Chlorhexidine Anesthesia: the lesion was anesthetized in a standard fashion   Anesthetic:  1% lidocaine w/ epinephrine 1-100,000 local infiltration Punch size:  8 mm Suture size:  4-0 Suture type: nylon   Hemostasis achieved with: suture   Outcome:  patient tolerated procedure well   Post-procedure details: wound care instructions given   Post-procedure details comment:  Nonsterile  Specimen 1 - Surgical pathology Differential Diagnosis: BCC Check Margins: yes   I, Arvilla Salada, PA-C, have reviewed all documentation's for this visit.  The documentation on 01/10/20 for the exam, diagnosis, procedures and orders are all accurate and complete.

## 2019-11-21 ENCOUNTER — Ambulatory Visit (INDEPENDENT_AMBULATORY_CARE_PROVIDER_SITE_OTHER): Payer: 59

## 2019-11-21 ENCOUNTER — Other Ambulatory Visit: Payer: Self-pay

## 2019-11-21 ENCOUNTER — Telehealth: Payer: Self-pay | Admitting: *Deleted

## 2019-11-21 DIAGNOSIS — Z4802 Encounter for removal of sutures: Secondary | ICD-10-CM

## 2019-11-21 NOTE — Progress Notes (Signed)
SUTURE REMOVAL X 3 PRESENT PATIENT AWARE OF PATH AND MOHS REFERRAL

## 2019-11-21 NOTE — Telephone Encounter (Signed)
Mohs referral for patient di=one via proficient. Patient notified.

## 2019-11-21 NOTE — Telephone Encounter (Signed)
-----   Message from Warren Danes, [place] sent at 11/20/2019  9:30 AM EDT ----- Mohs.

## 2019-12-20 ENCOUNTER — Other Ambulatory Visit: Payer: Self-pay

## 2019-12-20 ENCOUNTER — Ambulatory Visit: Payer: 59 | Admitting: Family Medicine

## 2019-12-20 VITALS — BP 121/87 | Ht 64.0 in | Wt 163.0 lb

## 2019-12-20 DIAGNOSIS — M7501 Adhesive capsulitis of right shoulder: Secondary | ICD-10-CM | POA: Diagnosis not present

## 2019-12-20 DIAGNOSIS — M19042 Primary osteoarthritis, left hand: Secondary | ICD-10-CM

## 2019-12-20 DIAGNOSIS — M79645 Pain in left finger(s): Secondary | ICD-10-CM | POA: Diagnosis not present

## 2019-12-20 DIAGNOSIS — M19049 Primary osteoarthritis, unspecified hand: Secondary | ICD-10-CM | POA: Diagnosis not present

## 2019-12-20 DIAGNOSIS — M19041 Primary osteoarthritis, right hand: Secondary | ICD-10-CM | POA: Diagnosis not present

## 2019-12-20 DIAGNOSIS — M79644 Pain in right finger(s): Secondary | ICD-10-CM

## 2019-12-20 MED ORDER — METHYLPREDNISOLONE ACETATE 40 MG/ML IJ SUSP
40.0000 mg | Freq: Once | INTRAMUSCULAR | Status: AC
  Administered 2019-12-20: 40 mg via INTRA_ARTICULAR

## 2019-12-20 NOTE — Progress Notes (Signed)
  Paula Aguilar - 64 y.o. female MRN [id number]  Date of birth: 1955-04-11    SUBJECTIVE:      Chief Complaint:/ HPI:   bilateral index finger MCP joint pain. Has had before. Last year we injected the right one and that significantly helped. She would request bilateral injections today. 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 Regarding her right shoulder, it is much improved.    OBJECTIVE: BP 121/87   Ht 5\' 4"  (1.626 m)   Wt 163 lb (73.9 kg)   BMI 27.98 kg/m   Physical Exam:  Vital signs are reviewed. GEN WD WN NAD Right shoulder FROm that is painless MCP index finger bilaterally significant deformity and synovial hypertrophy. No unusual erythema or warmth. TTP and movement increases pain PROCEDURE: INJECTION: Patient was given informed consent, signed copy in the chart. Appropriate time out was taken. Area prepped and draped in usual sterile fashion. Ethyl chloride was  used for local anesthesia. A 21 gauge 1 1/2 inch needle was used.. 1/2 cc of methylprednisolone 40 mg/ml plus  1/2 cc of 1% lidocaine without epinephrine was injected into the bilateral index MCP joints using a(n) medial approach.   The patient tolerated the procedure well. There were no complications. Post procedure instructions were given.   ASSESSMENT & PLAN:  See problem based charting & AVS for pt instructions. Adhesive capsulitis of right shoulder Significant improvement in motion and pain F/u prn Continue HEP  Hand arthritis Bilateral index MCP joints. She has significant deformity and a lot of synovial hypertrophy. Hopefully this will he;lp and if not she will let me know

## 2019-12-20 NOTE — Assessment & Plan Note (Signed)
Significant improvement in motion and pain F/u prn Continue HEP

## 2019-12-20 NOTE — Assessment & Plan Note (Signed)
Bilateral index MCP joints. She has significant deformity and a lot of synovial hypertrophy. Hopefully this will he;lp and if not she will let me know

## 2019-12-21 ENCOUNTER — Other Ambulatory Visit: Payer: Self-pay | Admitting: Family Medicine

## 2019-12-31 ENCOUNTER — Ambulatory Visit: Payer: 59 | Admitting: Family Medicine

## 2019-12-31 ENCOUNTER — Encounter: Payer: Self-pay | Admitting: Family Medicine

## 2019-12-31 ENCOUNTER — Other Ambulatory Visit: Payer: Self-pay

## 2019-12-31 VITALS — BP 110/70 | HR 68 | Ht 64.0 in | Wt 171.2 lb

## 2019-12-31 DIAGNOSIS — R0781 Pleurodynia: Secondary | ICD-10-CM | POA: Diagnosis not present

## 2019-12-31 NOTE — Patient Instructions (Signed)
Paula Aguilar,  It was a pleasure taking care of you today. As we discussed, I think it is likely that you either bruised or fractured one of your ribs and may have also strained your shoulder when you fell. I expect both of these to resolve on their own over the next several weeks. In the meantime, keep active and continue taking ibuprofen and Tylenol for pain management. If you develop fever, shortness or breath, or a worsening cough, please come back to our clinic.  Mel Almond and Dr. Erin Hearing  Rib Fracture  A rib fracture is a break or crack in one of the bones of the ribs. The ribs are like a cage that goes around your upper chest. A broken or cracked rib is often painful, but most do not cause other problems. Most rib fractures usually heal on their own in 1-3 months. Follow these instructions at home: Managing pain, stiffness, and swelling  If directed, apply ice to the injured area. ? Put ice in a plastic bag. ? Place a towel between your skin and the bag. ? Leave the ice on for 20 minutes, 2-3 times a day.  Take over-the-counter and prescription medicines only as told by your doctor. Activity  Avoid activities that cause pain to the injured area. Protect your injured area.  Slowly increase activity as told by your doctor. General instructions  Do deep breathing as told by your doctor. You may be told to: ? Take deep breaths many times a day. ? Cough many times a day while hugging a pillow. ? Use a device (incentive spirometer) to do deep breathing many times a day.  Drink enough fluid to keep your pee (urine) clear or pale yellow.  Do not wear a rib belt or binder. These do not allow you to breathe deeply.  Keep all follow-up visits as told by your doctor. This is important. Contact a doctor if:  You have a fever. Get help right away if:  You have trouble breathing.  You are short of breath.  You cannot stop coughing.  You cough up thick or bloody spit  (sputum).  You feel sick to your stomach (nauseous), throw up (vomit), or have belly (abdominal) pain.  Your pain gets worse and medicine does not help. Summary  A rib fracture is a break or crack in one of the bones of the ribs.  Apply ice to the injured area and take medicines for pain as told by your doctor.  Take deep breaths and cough many times a day. Hug a pillow every time you cough. This information is not intended to replace advice given to you by your health care provider. Make sure you discuss any questions you have with your health care provider. Document Revised: 01/20/2017 Document Reviewed: 05/10/2016 Elsevier Patient Education  2020 [employer].

## 2019-12-31 NOTE — Progress Notes (Addendum)
    SUBJECTIVE:   CHIEF COMPLAINT / HPI:  Rib Pain: Paula Aguilar is a 64 yo female who presents with R-sided rib pain after a fall four days ago. She was standing on a sidewalk with her dog when he pulled her over. She fell to the pavement and landed flat on her R side. She reports that immediately after the fall she had some bruising over her R hip and pain along her chest wall. She has been taking ibuprofen and Tylenol for pain with moderate reduction in her symptoms. She has pain with abduction of her arm, lifting, and some motions while driving. Pain is also increased with deep inspiration. 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 The pain is somewhat improved today compared to four days ago. She denies any SOB, increased cough, reflux, or fever.    OBJECTIVE:   BP 110/70   Pulse 68   Ht 5\' 4"  (1.626 m)   Wt 171 lb 3.2 oz (77.7 kg)   SpO2 97%   BMI 29.39 kg/m   Physical Exam Vitals reviewed.  Constitutional:      General: She is not in acute distress. Cardiovascular:     Rate and Rhythm: Normal rate and regular rhythm.  Pulmonary:     Effort: Pulmonary effort is normal.     Breath sounds: Normal breath sounds.  Chest:     Chest wall: Tenderness present. No deformity, swelling, crepitus or edema.    Musculoskeletal:     Comments: Full active ROM of both shoulders. Pain with active and passive abduction of R arm.   Neurological:     Mental Status: She is alert.      ASSESSMENT/PLAN:   Rib pain: Her history and physical exam are consistent with a rib contusion vs. Fracture. No evidence of deformity on exam and no evidence of pneumothorax or diaphragmatic injury. No evidence of underlying pneumonia.  - No indication for imaging - Continue with ibuprofen and Tylenol for pain - Return precautions given for fever, shortness of breath, or worsening cough. - She may continue with work duties as  tolerated   [name], [place]   I was present during key history taking and physical exam  I agree with the note above Samella Parr MD

## 2020-03-18 ENCOUNTER — Other Ambulatory Visit: Payer: Self-pay | Admitting: Family Medicine

## 2020-04-03 ENCOUNTER — Other Ambulatory Visit: Payer: Self-pay | Admitting: Family Medicine

## 2020-04-16 ENCOUNTER — Other Ambulatory Visit: Payer: Self-pay | Admitting: Family Medicine

## 2020-04-16 DIAGNOSIS — M4722 Other spondylosis with radiculopathy, cervical region: Secondary | ICD-10-CM

## 2020-05-12 ENCOUNTER — Encounter: Payer: Self-pay | Admitting: Physician Assistant

## 2020-05-12 ENCOUNTER — Other Ambulatory Visit: Payer: Self-pay

## 2020-05-12 ENCOUNTER — Ambulatory Visit (INDEPENDENT_AMBULATORY_CARE_PROVIDER_SITE_OTHER): Payer: 59 | Admitting: Physician Assistant

## 2020-05-12 DIAGNOSIS — L570 Actinic keratosis: Secondary | ICD-10-CM

## 2020-05-12 DIAGNOSIS — Z1283 Encounter for screening for malignant neoplasm of skin: Secondary | ICD-10-CM

## 2020-05-12 DIAGNOSIS — Z85828 Personal history of other malignant neoplasm of skin: Secondary | ICD-10-CM | POA: Diagnosis not present

## 2020-05-12 NOTE — Progress Notes (Signed)
   Follow-Up Visit   Subjective  Paula Aguilar is a 65 y.o. female who presents for the following: Follow-up (94MO F/U MOHS RIGHT FRONT SCALP /RIGHT FOREHEAD NOW TENDER AND PAINFUL TO TOUCH, [place] IT AT LAST VISIT).   The following portions of the chart were reviewed this encounter and updated as appropriate:  Tobacco  Allergies  Meds  Problems  Med Hx  Surg Hx  Fam Hx      Objective  Well appearing patient in no apparent distress; mood and affect are within normal limits.  All skin waist up examined.  Objective  Left Eyebrow, Left Temple (2), Left Temporal Scalp, Right Nasal Sidewall: Erythematous patches with gritty scale.  Objective  waist up: No atypical nevi No signs of non-mole skin cancer.   Objective  left temple: Linear scar with 3 pink areas within the scar   Assessment & Plan  AK (actinic keratosis) (5) Left Eyebrow; Left Temple (2); Right Nasal Sidewall; Left Temporal Scalp  Destruction of lesion - Left Eyebrow, Left Temple (2), Left Temporal Scalp, Right Nasal Sidewall Complexity: simple   Destruction method: cryotherapy   Informed consent: discussed and consent obtained   Timeout:  patient name, date of birth, surgical site, and procedure verified Lesion destroyed using liquid nitrogen: Yes   Cryotherapy cycles:  3 Outcome: patient tolerated procedure well with no complications    Encounter for screening for malignant neoplasm of skin waist up  Yearly skin exams  History of basal cell carcinoma left temple  Recheck 6 months    I, Yianna Tersigni, PA-C, have reviewed all documentation's for this visit.  The documentation on 05/12/20 for the exam, diagnosis, procedures and orders are all accurate and complete.

## 2020-07-03 ENCOUNTER — Other Ambulatory Visit: Payer: Self-pay

## 2020-07-03 ENCOUNTER — Ambulatory Visit (INDEPENDENT_AMBULATORY_CARE_PROVIDER_SITE_OTHER): Payer: 59 | Admitting: Family Medicine

## 2020-07-03 DIAGNOSIS — M25511 Pain in right shoulder: Secondary | ICD-10-CM

## 2020-07-03 DIAGNOSIS — M19042 Primary osteoarthritis, left hand: Secondary | ICD-10-CM

## 2020-07-03 MED ORDER — METHYLPREDNISOLONE ACETATE 40 MG/ML IJ SUSP
40.0000 mg | Freq: Once | INTRAMUSCULAR | Status: AC
  Administered 2020-07-03: 40 mg via INTRA_ARTICULAR

## 2020-07-03 NOTE — Progress Notes (Signed)
  Paula Aguilar - 65 y.o. female MRN [id number]  Date of birth: 09/13/1955    SUBJECTIVE:      Chief Complaint:/ HPI:  Posterior right shoulder pain: She thinks it is related to her increased hours at work.  She has a lot of pain in the posterior part of her shoulder, particularly worse at the end of a long day.  She is also been doing some increased driving. 2.  Left thumb pain.  Has had injection therapy before and it helps for significant amount of time and she would like to consider that today.  She has about another year to work and is hopeful that when she retires from work she will have less hand joint pain.    OBJECTIVE: BP 136/78   Ht 5\' 4"  (1.626 m)   Wt 165 lb (74.8 kg)   BMI 28.32 kg/m   Physical Exam:  Vital signs are reviewed. GENERAL: Well-developed female no acute distress SHOULDERS: Full range of motion bilaterally.  The right shoulder has some tenderness to palpation over the superior border of the scapula at the insertion of the levator scapula.  This reproduces her pain. HANDS: Bilaterally she has deformity and swelling/synovitis of bilateral MCP of thumb and first joint.  The left is tender to palpation.  No erythema. PROCEDURE: INJECTION: Patient was given informed consent, signed copy in the chart. Appropriate time out was taken. Area prepped and draped in usual sterile fashion. Ethyl chloride was  used for local anesthesia. A 21 gauge 1 1/2 inch needle was used..  A mixture of 1 cc of methylprednisolone 40 mg/ml plus 1 cc of 1% lidocaine without epinephrine, total of 1 cc was injected into the left thumb using a(n) medial approach.   The patient tolerated the procedure well. There were no complications. Post procedure instructions were given.  PROCEDURE: INJECTION: Patient was given informed consent, signed copy in the chart. Appropriate time out was taken. Area prepped and draped in usual sterile fashion. Ethyl chloride was  used for local anesthesia. A 21 gauge 1  1/2 inch needle was used..  1 cc of methylprednisolone 40 mg/ml plus 1 cc of 1% lidocaine without epinephrine was injected into the 3 sites of the superior border of the right scapula including the insertion of the levator scapula using a(n) perpendicular approach.   The patient tolerated the procedure well. There were no complications. Post procedure instructions were given.   ASSESSMENT & PLAN:  See problem based charting & AVS for pt instructions. No problem-specific Assessment & Plan notes found for this encounter.

## 2020-07-09 ENCOUNTER — Other Ambulatory Visit: Payer: Self-pay | Admitting: Family Medicine

## 2020-07-20 ENCOUNTER — Other Ambulatory Visit: Payer: Self-pay | Admitting: Family Medicine

## 2020-07-20 DIAGNOSIS — I10 Essential (primary) hypertension: Secondary | ICD-10-CM

## 2020-08-15 ENCOUNTER — Other Ambulatory Visit: Payer: Self-pay | Admitting: Family Medicine

## 2020-10-30 ENCOUNTER — Ambulatory Visit: Payer: 59 | Admitting: Family Medicine

## 2020-10-30 ENCOUNTER — Encounter: Payer: Self-pay | Admitting: Family Medicine

## 2020-10-30 ENCOUNTER — Other Ambulatory Visit: Payer: Self-pay

## 2020-10-30 VITALS — Ht 64.0 in | Wt 165.0 lb

## 2020-10-30 DIAGNOSIS — M25511 Pain in right shoulder: Secondary | ICD-10-CM | POA: Diagnosis not present

## 2020-10-30 DIAGNOSIS — M25512 Pain in left shoulder: Secondary | ICD-10-CM

## 2020-10-30 MED ORDER — METHYLPREDNISOLONE ACETATE 40 MG/ML IJ SUSP
40.0000 mg | Freq: Once | INTRAMUSCULAR | Status: AC
  Administered 2020-10-30: 40 mg via INTRA_ARTICULAR

## 2020-10-30 NOTE — Progress Notes (Signed)
[facility]: Attending Note: I have reviewed the chart, discussed wit the Sports Medicine Fellow. I agree with assessment and treatment plan as detailed in the Fellow's note.  

## 2020-10-30 NOTE — Progress Notes (Signed)
   Paula Aguilar is a 65 y.o. female who presents to [facility] today for the following:  Bilateral shoulder pain Prior history of adhesive capsulitis in right shoulder Has had prior subacromial injections in right Last shoulder XR was right in 5/21, showing mild AC arthritis, otherwise normal No left shoulder films She reports over the last few months she has been having worsening shoulder pain, right is worse than left States that she has been doing more lifting at work and has pain with lifting and overhead activity She states that pain radiates to the lateral aspect of both of her arms States that she has received shots before that have been helpful, usually last about 3 months She states that she is retiring next year and looking forward to this She does note occasional numbness and tingling states that she has a pinched nerve in her neck that she is aware of, but does not feel that this pain is the same  PMH reviewed.  ROS as above. Medications reviewed.  Exam:  Ht '5\' 4"'$  (1.626 m)   Wt 165 lb (74.8 kg)   BMI 28.32 kg/m  Gen: Well NAD MSK:  Bilateral shoulders: Inspection reveals no obvious deformity, atrophy, or asymmetry b/l. No bruising. No swelling Palpation is normal with no TTP over [facility] joint or bicipital groove b/l. Full ROM in flexion, abduction, internal/external rotation b/l.  She has pain at the extreme of all ranges of motion. NV intact distally b/l Normal scapular function observed b/l Special Tests:  - Impingement: Positive Hawkins bilaterally - Supraspinatous: Equivocal empty can bilaterally - Infraspinatous/Teres Minor: 5/5 strength with ER - Subscapularis: 5/5 strength with IR - Biceps tendon: Equivocal speeds bilaterally - Labrum: Negative Obriens, negative clunk, good stability - AC Joint: Negative cross arm - Negative apprehension test - No painful arc and no drop arm sign   No results found.   Assessment and Plan: 1) Acute pain of both  shoulders Pain most likely secondary to rotator cuff tendinitis.  Discussed risk and benefits of injection and patient opted to proceed with bilateral subacromial corticosteroid injections per below.  She tolerated the procedure well.  She can follow-up here as needed.  Continue gentle range of motion exercises.  Procedure performed: right subacromial corticosteroid injection; palpation guided  Consent obtained and verified. Time-out conducted. Noted no overlying erythema, induration, or other signs of local infection. The right posterior subacromial space was palpated and marked. The overlying skin was prepped in a sterile fashion. Topical analgesic spray: Ethyl chloride. Joint: right subacromial Needle: 25 gauge, 1.5 inch Completed without difficulty. Meds: 3 cc 1% lidocaine without epinephrine, 40 mg depo-medrol   Procedure performed: subacromial corticosteroid injection; palpation guided  Consent obtained and verified. Time-out conducted. Noted no overlying erythema, induration, or other signs of local infection. The left posterior subacromial space was palpated and marked. The overlying skin was prepped in a sterile fashion. Topical analgesic spray: Ethyl chloride. Joint: left subacromial Needle: 25 gauge, 1.5 inch Completed without difficulty. Meds: 3 cc 1% lidocaine without epinephrine, 40 mg depo-medrol   Advised to call if fevers/chills, erythema, induration, drainage, or persistent bleeding.   [place] Constable, D.O.  PGY-4 [facility]  10/30/2020 8:55 AM

## 2020-10-30 NOTE — Assessment & Plan Note (Signed)
Pain most likely secondary to rotator cuff tendinitis.  Discussed risk and benefits of injection and patient opted to proceed with bilateral subacromial corticosteroid injections per below.  She tolerated the procedure well.  She can follow-up here as needed.  Continue gentle range of motion exercises.  Procedure performed: right subacromial corticosteroid injection; palpation guided  Consent obtained and verified. Time-out conducted. Noted no overlying erythema, induration, or other signs of local infection. The right posterior subacromial space was palpated and marked. The overlying skin was prepped in a sterile fashion. Topical analgesic spray: Ethyl chloride. Joint: right subacromial Needle: 25 gauge, 1.5 inch Completed without difficulty. Meds: 3 cc 1% lidocaine without epinephrine, 40 mg depo-medrol   Procedure performed: subacromial corticosteroid injection; palpation guided  Consent obtained and verified. Time-out conducted. Noted no overlying erythema, induration, or other signs of local infection. The left posterior subacromial space was palpated and marked. The overlying skin was prepped in a sterile fashion. Topical analgesic spray: Ethyl chloride. Joint: left subacromial Needle: 25 gauge, 1.5 inch Completed without difficulty. Meds: 3 cc 1% lidocaine without epinephrine, 40 mg depo-medrol   Advised to call if fevers/chills, erythema, induration, drainage, or persistent bleeding.

## 2020-11-12 ENCOUNTER — Other Ambulatory Visit: Payer: Self-pay

## 2020-11-12 ENCOUNTER — Ambulatory Visit (INDEPENDENT_AMBULATORY_CARE_PROVIDER_SITE_OTHER): Payer: 59 | Admitting: Physician Assistant

## 2020-11-12 ENCOUNTER — Encounter: Payer: Self-pay | Admitting: Physician Assistant

## 2020-11-12 DIAGNOSIS — Z1283 Encounter for screening for malignant neoplasm of skin: Secondary | ICD-10-CM | POA: Diagnosis not present

## 2020-11-12 DIAGNOSIS — Z85828 Personal history of other malignant neoplasm of skin: Secondary | ICD-10-CM | POA: Diagnosis not present

## 2020-11-12 NOTE — Progress Notes (Signed)
   Follow-Up Visit   Subjective  Paula Aguilar is a 65 y.o. female who presents for the following: Follow-up (Patient here today for 6 month follow up from previous LN2. Per patient the area's that were LN2 did heal very quickly. No new concerns. Personal history of non mole skin cancer. No family history of atypical moles, melanoma or non mole skin cancer. ). Her BCC that was treated with Mohs surgery is clear but it itches sometimes.    The following portions of the chart were reviewed this encounter and updated as appropriate:  Tobacco  Allergies  Meds  Problems  Med Hx  Surg Hx  Fam Hx      Objective  Well appearing patient in no apparent distress; mood and affect are within normal limits.  All skin waist up examined.  waist up and legs No atypical nevi or signs of NMSC noted at the time of the visit. Dyspigmented scars all clear.   Assessment & Plan  Skin exam for malignant neoplasm waist up and legs  Yearly skin check    I, Lourine Alberico, PA-C, have reviewed all documentation's for this visit.  The documentation on 11/12/20 for the exam, diagnosis, procedures and orders are all accurate and complete.

## 2021-01-08 ENCOUNTER — Encounter: Payer: Self-pay | Admitting: Family Medicine

## 2021-01-08 ENCOUNTER — Ambulatory Visit (INDEPENDENT_AMBULATORY_CARE_PROVIDER_SITE_OTHER): Payer: 59 | Admitting: Family Medicine

## 2021-01-08 VITALS — BP 133/94 | Ht 64.0 in | Wt 175.0 lb

## 2021-01-08 DIAGNOSIS — M545 Low back pain, unspecified: Secondary | ICD-10-CM | POA: Diagnosis not present

## 2021-01-08 DIAGNOSIS — M7062 Trochanteric bursitis, left hip: Secondary | ICD-10-CM | POA: Diagnosis not present

## 2021-01-08 MED ORDER — METHYLPREDNISOLONE ACETATE 40 MG/ML IJ SUSP
40.0000 mg | Freq: Once | INTRAMUSCULAR | Status: AC
  Administered 2021-01-08: 40 mg via INTRA_ARTICULAR

## 2021-01-08 NOTE — Patient Instructions (Signed)
On the home

## 2021-01-08 NOTE — Progress Notes (Signed)
  Paula Aguilar - 65 y.o. female MRN [id number]  Date of birth: 04-Dec-1955    SUBJECTIVE:      Chief Complaint:/ HPI:  Left hip and low back pain.  She has had chronic pain in her low back for some time but notes that in the last few weeks she is having difficulty straightening up from a stooped posture secondary to pain.  Ultimately she can attain totally straight upright posture but feels like it is more painful than it used to be.  The left hip area hurts with certain motions such as walking particularly if she is carrying a heavy package.  Pain is lateral.  No bowel or bladder changes.  No numbness or tingling in her legs.  No falls and no giving way of her lower extremities.    OBJECTIVE: BP (!) 133/94   Ht 5\' 4"  (1.626 m)   Wt 175 lb (79.4 kg)   BMI 30.04 kg/m   Physical Exam:  Vital signs are reviewed. GENERAL: Well-developed female no acute distress HIPS: Symmetrical.  Internal and external rotation is symmetrical, full, painless.  Extreme tenderness to palpation over the left trochanteric bursa.  Hip strength flexion extension, abduction and abduction is 5 out of 5 symmetrical. BACK: Lumbar area mildly tender to palpation in the lumbar musculature.  Area she points to for the source of pain is paravertebrally left greater than right, some pain over the area of the SI joint.   PROCEDURE: INJECTION: Patient was given informed consent, signed copy in the chart. Appropriate time out was taken. Area prepped and draped in usual sterile fashion. Ethyl chloride was  used for local anesthesia. A 21 gauge 1 1/2 inch needle was used..  1 cc of methylprednisolone 40 mg/ml plus 4 cc of 1% lidocaine without epinephrine was injected into the left greater trochanteric bursa of the hip using a(n) lateral approach.   The patient tolerated the procedure well. There were no complications. Post procedure instructions were given.  ASSESSMENT & PLAN:  See problem based charting & AVS for pt  instructions. No problem-specific Assessment & Plan notes found for this encounter.  #1.  Greater trochanteric bursitis: CSI given today.  We will place her on home exercise program for hip abduction #2.  Stiffness and pain in the lumbar area: This is musculoskeletal related to tightness of muscles, some underlying chronic arthritis etc.  We will start her on quadruped and superman exercises.  She will follow-up if not improving or if she has new or worsening symptoms.

## 2021-01-14 ENCOUNTER — Other Ambulatory Visit: Payer: Self-pay | Admitting: Family Medicine

## 2021-04-02 ENCOUNTER — Ambulatory Visit (INDEPENDENT_AMBULATORY_CARE_PROVIDER_SITE_OTHER): Payer: 59 | Admitting: Family Medicine

## 2021-04-02 VITALS — BP 118/74 | Ht 64.0 in | Wt 170.0 lb

## 2021-04-02 DIAGNOSIS — M7502 Adhesive capsulitis of left shoulder: Secondary | ICD-10-CM | POA: Diagnosis not present

## 2021-04-02 DIAGNOSIS — M25559 Pain in unspecified hip: Secondary | ICD-10-CM | POA: Diagnosis not present

## 2021-04-02 DIAGNOSIS — M25552 Pain in left hip: Secondary | ICD-10-CM | POA: Diagnosis not present

## 2021-04-02 MED ORDER — METHYLPREDNISOLONE ACETATE 40 MG/ML IJ SUSP
40.0000 mg | Freq: Once | INTRAMUSCULAR | Status: AC
  Administered 2021-04-02: 40 mg via INTRA_ARTICULAR

## 2021-04-02 MED ORDER — CYCLOBENZAPRINE HCL 10 MG PO TABS: 10.0000 mg | ORAL_TABLET | Freq: Every evening | ORAL | 5 refills | Status: DC | PRN

## 2021-04-02 NOTE — Progress Notes (Signed)
°  Paula Aguilar - 66 y.o. female MRN [id number]  Date of birth: Feb 10, 1956    SUBJECTIVE:      Chief Complaint:/ HPI:   1. Recurrence of left hip pain. Had same last year and CSI improved significantly. Some pain with using stairs or walking long time. Lateral part of hip is most painful but occasionally will have twinge in groin area. 2. 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 Planning to retire in August!    OBJECTIVE: BP 118/74    Ht 5\' 4"  (1.626 m)    Wt 170 lb (77.1 kg)    BMI 29.18 kg/m   Physical Exam:  Vital signs are reviewed. GEN WD WN NAD Hips : Bilaterally FROm and IR/ER is not painful or reduced. She is TTP left lateral hip over greater trochanteric bursa area, Gait is normal. No trendelenburg. Shoulder symmetrical. 5/5 strength in all apm\nes of rotator cuff but significant pain that reduces her AROm on left with posterior reach behind. Unable to do lift off test on left. Normal on right Distally NV intact.  PROCEDURE: INJECTION: Patient was given informed consent, signed copy in the chart. Appropriate time out was taken. Area prepped and draped in usual sterile fashion. Ethyl chloride was  used for local anesthesia. A 21 gauge 1 1/2 inch needle was used.. 1 cc of methylprednisolone 40 mg/ml plus  4 cc of 1% lidocaine without epinephrine was injected into the greater trochanteric hip bursa using a(n)  lateral approach.   The patient tolerated the procedure well. There were no complications. Post procedure instructions were given.  PROCEDURE: INJECTION: Patient was given informed consent, signed copy in the chart. Appropriate time out was taken. Area prepped and draped in usual sterile fashion. Ethyl chloride was  used for local anesthesia. A 21 gauge 1 1/2 inch needle was used.. 1 cc of methylprednisolone 40 mg/ml plus  4 cc  of 1%  lidocaine without epinephrine was injected into the left shoulder subacromial bursa using a(n) posterior approach.   The patient tolerated the procedure well. There were no complications. Post procedure instructions were given.   ASSESSMENT & PLAN: Left hip pain secondary to greater trochanteric bursitis Left shoulder pain: likely combination of rotator cuff tendinopathy but maybe some early adhesive capsulitis See problem based charting & AVS for pt instructions. No problem-specific Assessment & Plan notes found for this encounter.

## 2021-04-02 NOTE — Patient Instructions (Signed)
Today I gave you corticosteroid injection into your left shoulder and your left hip.  I hope these work as well as previously.  If they do not, please let me know.  I also called in some muscle relaxers to use at night.  Our goal is to keep you able to work until August.  Congratulations on your upcoming retirement and on your very acute new grandbaby!

## 2021-04-04 ENCOUNTER — Encounter: Payer: Self-pay | Admitting: Family Medicine

## 2021-04-22 ENCOUNTER — Other Ambulatory Visit: Payer: Self-pay | Admitting: Family Medicine

## 2021-04-26 ENCOUNTER — Other Ambulatory Visit: Payer: Self-pay | Admitting: Family Medicine

## 2021-05-07 ENCOUNTER — Telehealth: Payer: Self-pay

## 2021-05-07 NOTE — Telephone Encounter (Signed)
Patient contacted to schedule mammogram.  ?LVM for pt to call back as soon as possible.  ? ? ?RE: Mobile Mammo event located at: ? ?[facility]  ?      ?[address]    ?[place] [id number]    ? ?Date: April 7th  ? ?

## 2021-05-12 ENCOUNTER — Ambulatory Visit: Payer: 59 | Admitting: Physician Assistant

## 2021-06-25 ENCOUNTER — Encounter: Payer: Self-pay | Admitting: Family Medicine

## 2021-06-25 ENCOUNTER — Ambulatory Visit: Payer: 59 | Admitting: Family Medicine

## 2021-06-25 VITALS — BP 118/84 | Ht 64.0 in | Wt 168.0 lb

## 2021-06-25 DIAGNOSIS — M25511 Pain in right shoulder: Secondary | ICD-10-CM | POA: Diagnosis not present

## 2021-06-25 DIAGNOSIS — M25512 Pain in left shoulder: Secondary | ICD-10-CM

## 2021-06-25 MED ORDER — METHYLPREDNISOLONE ACETATE 40 MG/ML IJ SUSP
40.0000 mg | Freq: Once | INTRAMUSCULAR | Status: AC
  Administered 2021-06-25: 40 mg via INTRA_ARTICULAR

## 2021-06-25 NOTE — Progress Notes (Signed)
?  Paula Aguilar - 66 y.o. female MRN [id number]  Date of birth: 1955-03-01 ? ? ? ?SUBJECTIVE:    ?  ?Chief Complaint:/ HPI:  ?Bilateral shoulder pain.  Chronic for her.  Last had injection in both of them back in October and it really helped a lot.  She thinks she had an injection in the interim in one of her shoulders but cannot remember which.  Injections usually help for 2 months or so.  She is on the road to retirement this summer and wants to decrease pain for her remaining time.  No hand weakness or numbness. ? ? ? ?OBJECTIVE: BP 118/84   Ht '5\' 4"'$  (1.626 m)   Wt 168 lb (76.2 kg)   BMI 28.84 kg/m?   ?Physical Exam:  Vital signs are reviewed. ?GENERAL: Well-developed female no acute distress ?SHOULDERS: Symmetrical.  Full range of motion all planes the rotator cuff.  Pain with resisted range of motion in abduction and forward flexion.  This reproduces her typical pain.  Internal right rotation is intact bilaterally.  The acromioclavicular joint is nontender to palpation.  The bicep tendon is normal and nontender on both sides.  Distally she is neurovascularly intact to soft touch sensation both hands. ? ?PROCEDURE: INJECTION: ?Patient was given informed consent, signed copy in the chart. Appropriate time out was taken. Area prepped and draped in usual sterile fashion. Ethyl chloride was  used for local anesthesia. A 21 gauge 1 1/2 inch needle was used.. 1 cc of methylprednisolone 40 mg/ml plus  4cc of 1% lidocaine without epinephrine was injected into the Bilateral shoulder subacromial bursa of shoulder using a(n) posterior approach.  ? ?The patient tolerated the procedure well. There were no complications. Post procedure instructions were given. ? ? ?ASSESSMENT & PLAN: ? ?See problem based charting & AVS for pt instructions. ?No problem-specific Assessment & Plan notes found for this encounter. ? ?

## 2021-07-16 ENCOUNTER — Other Ambulatory Visit: Payer: Self-pay | Admitting: Family Medicine

## 2021-07-16 DIAGNOSIS — I10 Essential (primary) hypertension: Secondary | ICD-10-CM

## 2021-07-27 ENCOUNTER — Encounter: Payer: Self-pay | Admitting: *Deleted

## 2021-08-04 ENCOUNTER — Encounter: Payer: Self-pay | Admitting: Physician Assistant

## 2021-08-04 ENCOUNTER — Ambulatory Visit: Payer: 59 | Admitting: Physician Assistant

## 2021-08-04 DIAGNOSIS — Z85828 Personal history of other malignant neoplasm of skin: Secondary | ICD-10-CM | POA: Diagnosis not present

## 2021-08-04 DIAGNOSIS — L570 Actinic keratosis: Secondary | ICD-10-CM | POA: Diagnosis not present

## 2021-08-04 DIAGNOSIS — L820 Inflamed seborrheic keratosis: Secondary | ICD-10-CM

## 2021-08-04 MED ORDER — FLUOROURACIL 5 % EX CREA: TOPICAL_CREAM | CUTANEOUS | 0 refills | Status: DC

## 2021-08-04 NOTE — Progress Notes (Signed)
   Follow-Up Visit   Subjective  Paula Aguilar is a 66 y.o. female who presents for the following: Follow-up (6 month follow up new wart like lesion on the left hand x months. 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   The following portions of the chart were reviewed this encounter and updated as appropriate:  Tobacco  Allergies  Meds  Problems  Med Hx  Surg Hx  Fam Hx      Objective  Well appearing patient in no apparent distress; mood and affect are within normal limits.  A full examination was performed including scalp, head, eyes, ears, nose, lips, neck, chest, axillae, abdomen, back, buttocks, bilateral upper extremities, bilateral lower extremities, hands, feet, fingers, toes, fingernails, and toenails. All findings within normal limits unless otherwise noted below.  Chest (Upper Torso, Anterior), Head - Anterior (Face), Right Malar Cheek Erythematous patches with gritty scale.  Right Dorsal Hand, Right Forehead Brown crusted plaque on erythematous base.    Assessment & Plan  AK (actinic keratosis) (3) Head - Anterior (Face); Chest (Upper Torso, Anterior); Right Malar Cheek  Destruction of lesion - Chest (Upper Torso, Anterior), Head - Anterior (Face), Right Malar Cheek Complexity: simple   Destruction method: cryotherapy   Informed consent: discussed and consent obtained   Timeout:  patient name, date of birth, surgical site, and procedure verified Lesion destroyed using liquid nitrogen: Yes   Region frozen until ice ball extended beyond lesion: Yes   Cryotherapy cycles:  3 Outcome: patient tolerated procedure well with no complications    fluorouracil (EFUDEX) 5 % cream - Chest (Upper Torso, Anterior), Head - Anterior (Face) Apply to affected area nightly x 2 weeks  Inflamed seborrheic keratosis (2) Right Dorsal Hand; Right Forehead  Destruction of lesion - Right Dorsal Hand, Right Forehead Complexity: simple   Destruction method:  cryotherapy   Informed consent: discussed and consent obtained   Timeout:  patient name, date of birth, surgical site, and procedure verified Lesion destroyed using liquid nitrogen: Yes   Region frozen until ice ball extended beyond lesion: Yes   Cryotherapy cycles:  3 Outcome: patient tolerated procedure well with no complications      I, Jazarah Capili, PA-C, have reviewed all documentation's for this visit.  The documentation on 08/04/21 for the exam, diagnosis, procedures and orders are all accurate and complete.

## 2021-08-17 ENCOUNTER — Encounter: Payer: Self-pay | Admitting: Family Medicine

## 2021-08-17 ENCOUNTER — Ambulatory Visit: Payer: 59 | Admitting: Family Medicine

## 2021-08-17 VITALS — BP 134/90 | Ht 64.0 in | Wt 170.0 lb

## 2021-08-17 DIAGNOSIS — S6992XA Unspecified injury of left wrist, hand and finger(s), initial encounter: Secondary | ICD-10-CM

## 2021-08-17 DIAGNOSIS — S76312A Strain of muscle, fascia and tendon of the posterior muscle group at thigh level, left thigh, initial encounter: Secondary | ICD-10-CM | POA: Diagnosis not present

## 2021-09-15 IMAGING — DX DG SHOULDER 2+V*R*
3 series · 3 of 3 positions shown · non-contrast
Comparison: 03/26/2012

CLINICAL DATA: Right shoulder pain 7 years.

EXAM:
RIGHT SHOULDER - 2+ VIEW

[dg shoulder right (1 of 3)]
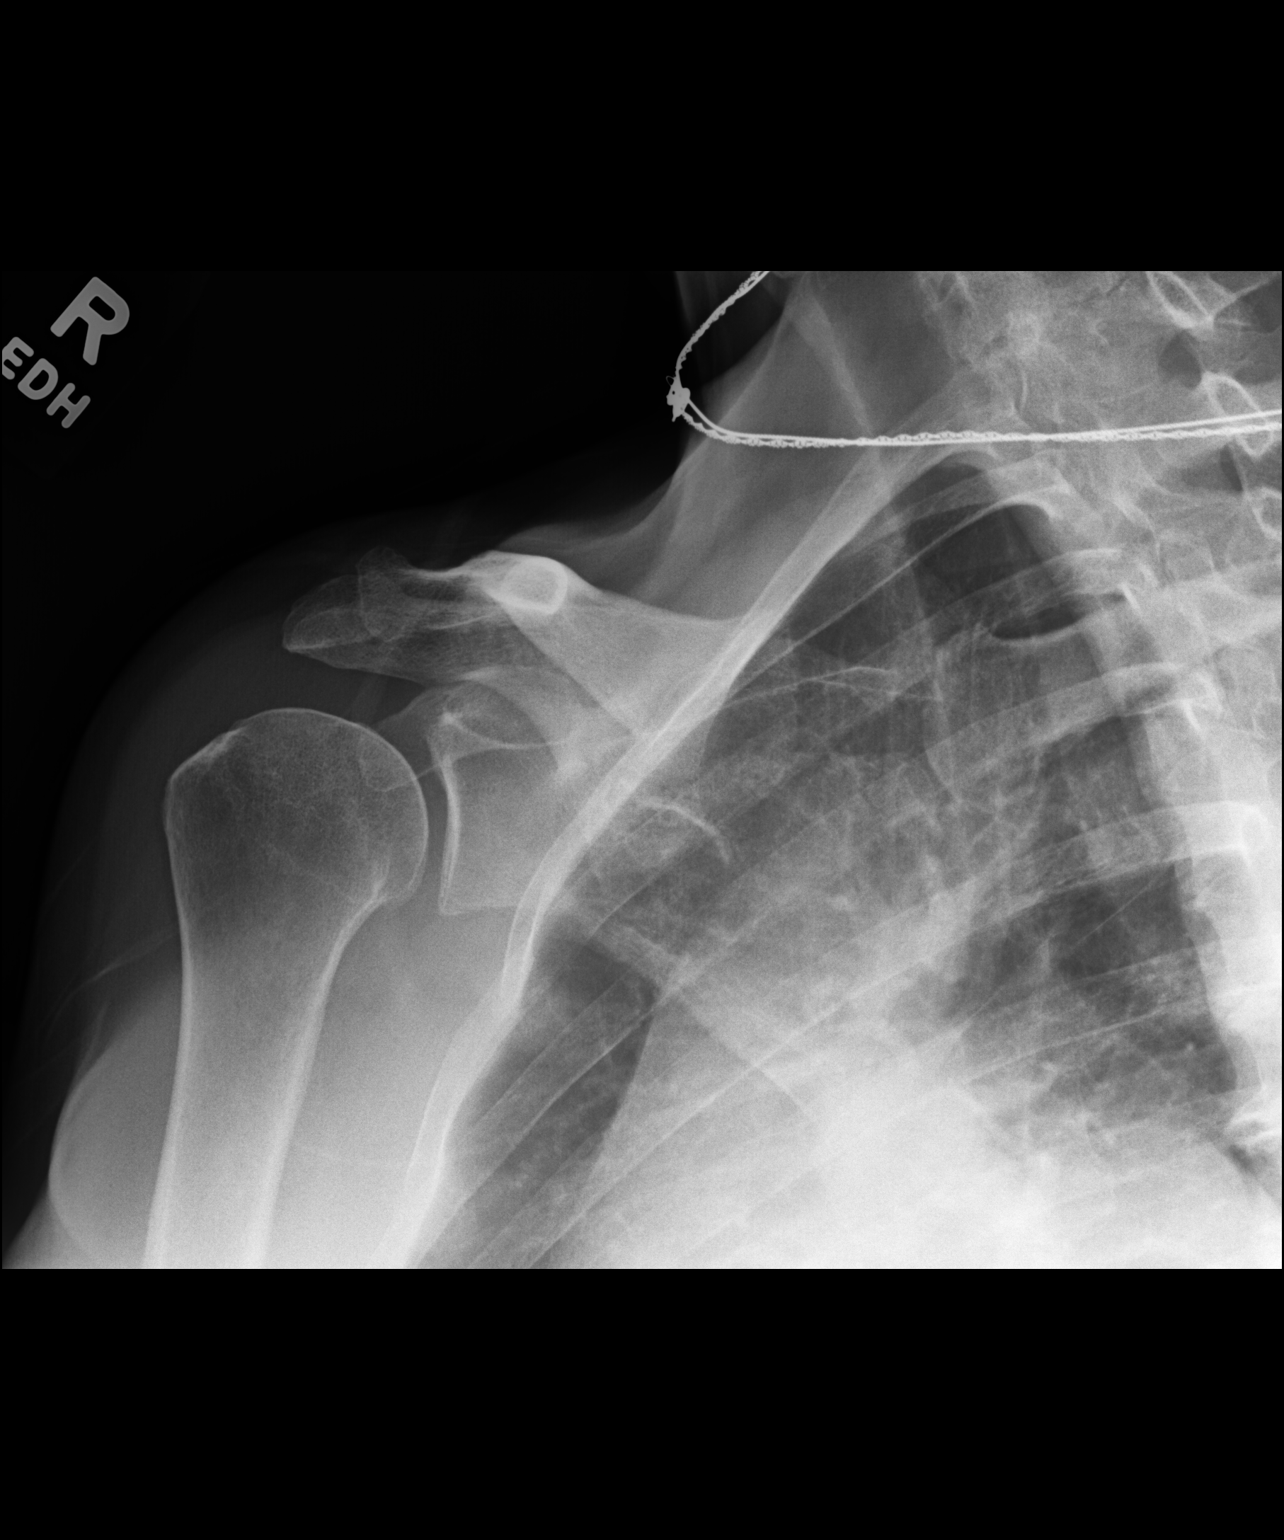

[dg shoulder right (2 of 3)]
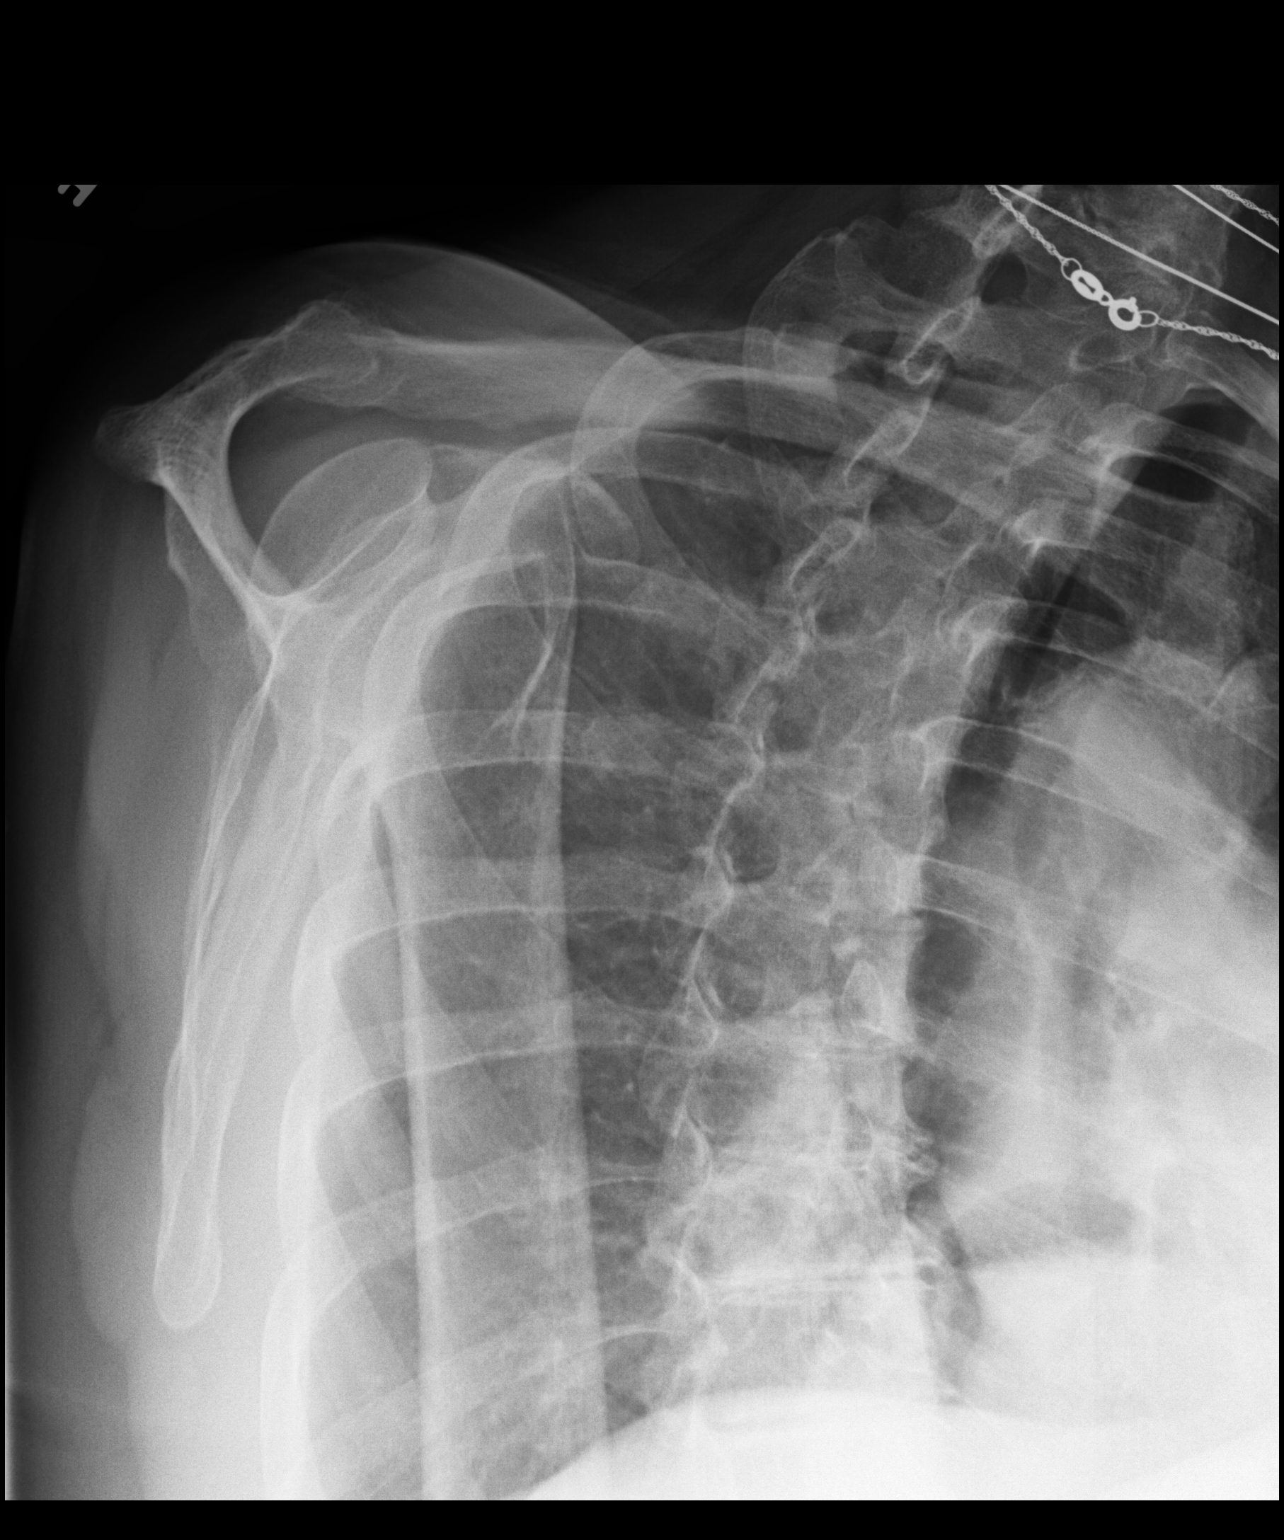

[dg shoulder right (3 of 3)]
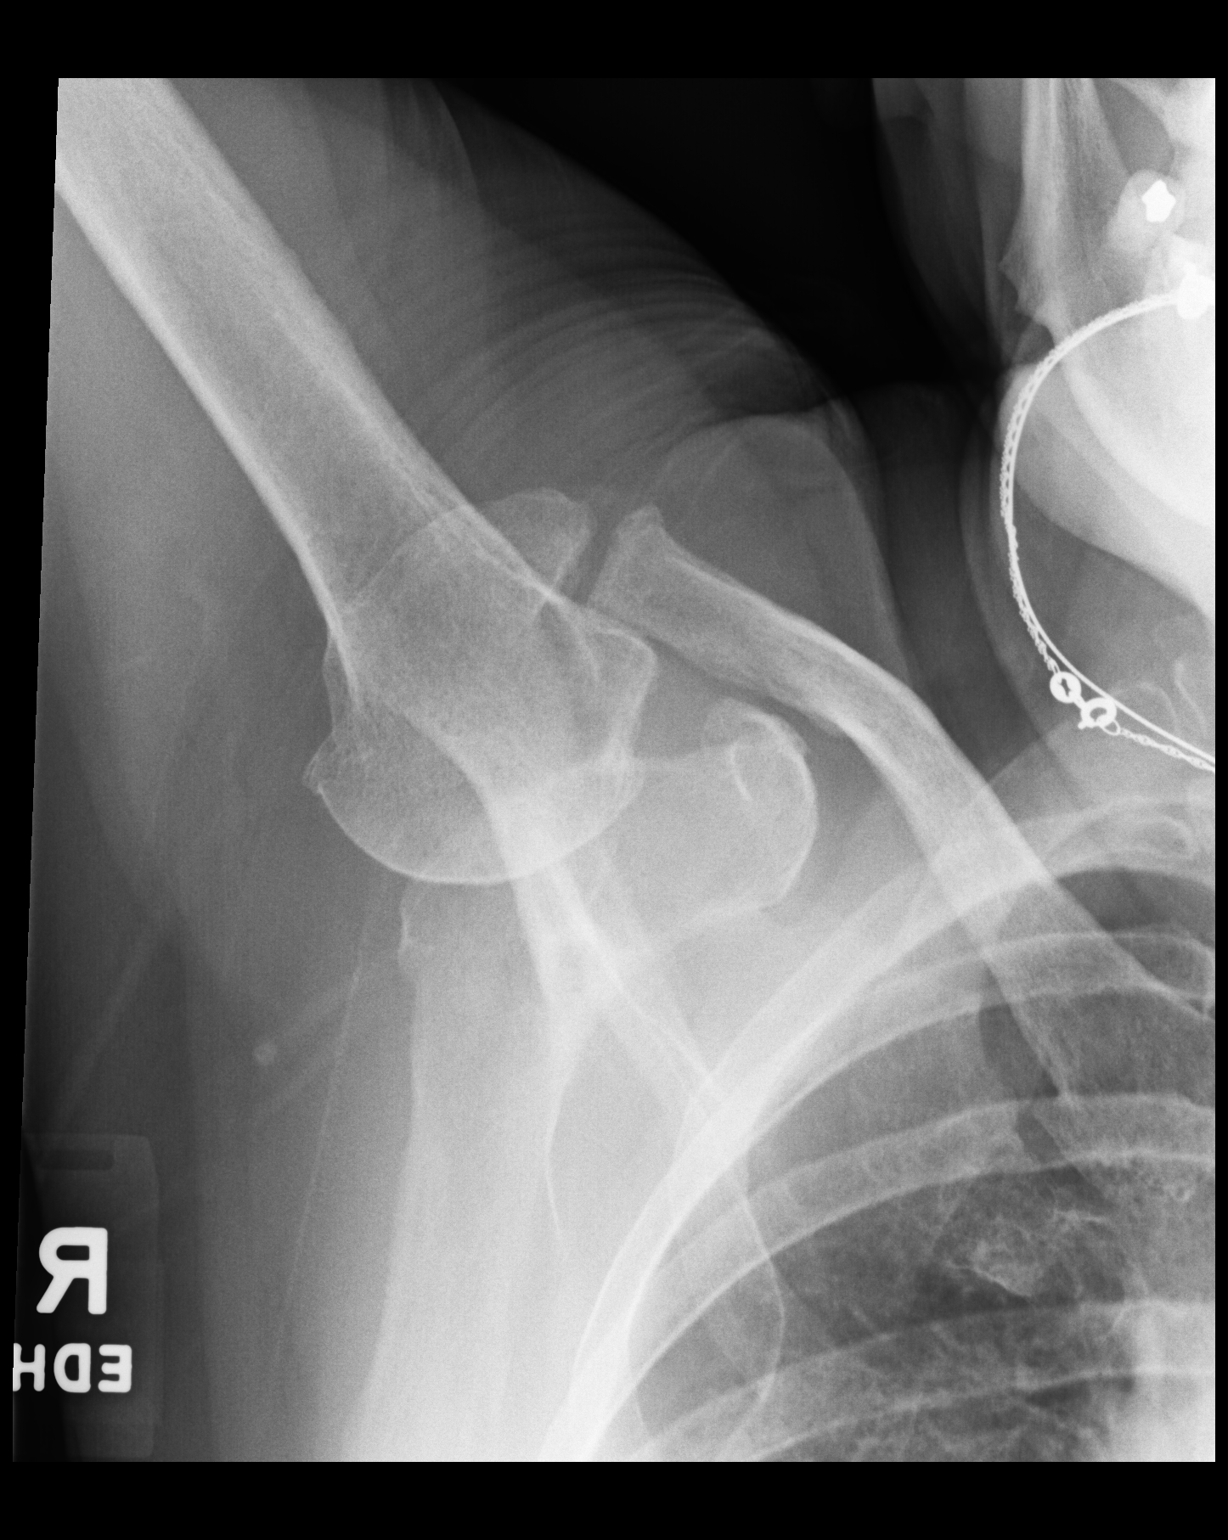

[3 of 3 positions shown; findings below may reference images not displayed]

FINDINGS: Negative for fracture or dislocation. Normal shoulder joint. Mild
degenerative change in the AC joint.
IMPRESSION: No acute abnormality.  Mild degenerative change in the AC joint.

## 2021-09-17 ENCOUNTER — Ambulatory Visit: Payer: 59 | Admitting: Family Medicine

## 2021-09-17 ENCOUNTER — Encounter: Payer: Self-pay | Admitting: Family Medicine

## 2021-09-17 VITALS — BP 130/80 | Ht 64.0 in | Wt 167.0 lb

## 2021-09-17 DIAGNOSIS — S76312S Strain of muscle, fascia and tendon of the posterior muscle group at thigh level, left thigh, sequela: Secondary | ICD-10-CM

## 2021-09-17 DIAGNOSIS — M25511 Pain in right shoulder: Secondary | ICD-10-CM | POA: Diagnosis not present

## 2021-09-17 DIAGNOSIS — M7501 Adhesive capsulitis of right shoulder: Secondary | ICD-10-CM | POA: Diagnosis not present

## 2021-09-17 DIAGNOSIS — M25552 Pain in left hip: Secondary | ICD-10-CM | POA: Diagnosis not present

## 2021-09-17 MED ORDER — MONTELUKAST SODIUM 10 MG PO TABS
10.0000 mg | ORAL_TABLET | Freq: Every day | ORAL | 3 refills | Status: DC
Start: 2021-09-17 — End: 2022-09-16

## 2021-09-17 MED ORDER — METHYLPREDNISOLONE ACETATE 40 MG/ML IJ SUSP
40.0000 mg | Freq: Once | INTRAMUSCULAR | Status: AC
  Administered 2021-09-17: 40 mg via INTRA_ARTICULAR

## 2021-09-17 MED ORDER — FUROSEMIDE 20 MG PO TABS: ORAL_TABLET | ORAL | 3 refills | Status: DC

## 2021-09-17 MED ORDER — ALBUTEROL SULFATE HFA 108 (90 BASE) MCG/ACT IN AERS: 2.0000 | INHALATION_SPRAY | Freq: Two times a day (BID) | RESPIRATORY_TRACT | 0 refills | Status: DC | PRN

## 2021-09-17 MED ORDER — CYCLOBENZAPRINE HCL 10 MG PO TABS: 10.0000 mg | ORAL_TABLET | Freq: Every evening | ORAL | 5 refills | Status: DC | PRN

## 2021-09-17 NOTE — Patient Instructions (Signed)
Today we discussed her right shoulder and left hip and left hamstring pain.  I gave you an injection in the right shoulder and the left hip bursa.  Hopefully these will help.  I also refilled your medications.  Also giving you a piece of Thera-Band to use to do some light exercises for that left hamstring strain.  I think this will help.  I would do for 5 repetitions twice a day.  Lay on her belly, loop your foot through the Thera-Band and pull against resistance pulling your heel towards your buttock.  Let me know how this works.

## 2021-09-17 NOTE — Progress Notes (Signed)
  Paula Aguilar - 66 y.o. female MRN [id number]  Date of birth: 1955-12-06    SUBJECTIVE:      Chief Complaint:/ HPI:    #1.  Shoulder pain: Would like to consider corticosteroid injection.  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  This is being chronic pain for her and corticosteroid injections help usually.  She just needs to get through the last couple of weeks.  Pain is worse when she is reaching forward to pick up something or out laterally to pick up something. 2.  Also having hip pain.  Has had this before many years ago we gave her an injection into the bursa and that seemed to help.  She would like to try that again today.   OBJECTIVE: BP 130/80   Ht '5\' 4"'$  (1.626 m)   Wt 167 lb (75.8 kg)   BMI 28.67 kg/m   Physical Exam:  Vital signs are reviewed. GENERAL: Well-developed female no acute distress shoulder: Bilaterally symmetrical.  She has pain with supraspinatus testing on the right.  She has intact strength. HIPS: Tender to palpation over the greater trochanteric bursa area.  Bilateral hips have intact and full range of motion internal and external rotation.  PROCEDURE: INJECTION: Patient was given informed consent, signed copy in the chart. Appropriate time out was taken. Area prepped and draped in usual sterile fashion. Ethyl chloride was  used for local anesthesia. A 21 gauge 1 1/2 inch needle was used..  1 cc of methylprednisolone 40 mg/ml plus 4 cc of 1% lidocaine without epinephrine was injected into the right shoulder subacromial bursa using a(n) posterior approach.   The patient tolerated the procedure well. There were no complications. Post procedure instructions were given.  PROCEDURE: INJECTION: Patient was given informed consent, signed copy in the chart. Appropriate time out was taken. Area prepped and draped in usual sterile fashion. Ethyl chloride was  used for local anesthesia. A 21 gauge 1 1/2 inch needle was used..  1 cc of  methylprednisolone 40 mg/ml plus 4 cc of 1% lidocaine without epinephrine was injected into the left greater trochanteric bursa of the hip using a(n) lateral approach.   The patient tolerated the procedure well. There were no complications. Post procedure instructions were given.   ASSESSMENT & PLAN:  See problem based charting & AVS for pt instructions. No problem-specific Assessment & Plan notes found for this encounter.

## 2021-10-06 ENCOUNTER — Other Ambulatory Visit: Payer: Self-pay | Admitting: Family Medicine

## 2021-10-11 ENCOUNTER — Other Ambulatory Visit: Payer: Self-pay | Admitting: Family Medicine

## 2022-01-22 ENCOUNTER — Other Ambulatory Visit: Payer: Self-pay | Admitting: Family Medicine

## 2022-03-09 ENCOUNTER — Ambulatory Visit (INDEPENDENT_AMBULATORY_CARE_PROVIDER_SITE_OTHER): Payer: 59 | Admitting: Family Medicine

## 2022-03-09 ENCOUNTER — Encounter: Payer: Self-pay | Admitting: Family Medicine

## 2022-03-09 VITALS — BP 128/82 | HR 76 | Ht 64.0 in | Wt 178.2 lb

## 2022-03-09 DIAGNOSIS — I10 Essential (primary) hypertension: Secondary | ICD-10-CM

## 2022-03-09 DIAGNOSIS — Z0000 Encounter for general adult medical examination without abnormal findings: Secondary | ICD-10-CM

## 2022-03-09 DIAGNOSIS — F4323 Adjustment disorder with mixed anxiety and depressed mood: Secondary | ICD-10-CM

## 2022-03-09 DIAGNOSIS — Z23 Encounter for immunization: Secondary | ICD-10-CM | POA: Diagnosis not present

## 2022-03-09 DIAGNOSIS — M25551 Pain in right hip: Secondary | ICD-10-CM

## 2022-03-09 DIAGNOSIS — M25552 Pain in left hip: Secondary | ICD-10-CM

## 2022-03-09 DIAGNOSIS — M4722 Other spondylosis with radiculopathy, cervical region: Secondary | ICD-10-CM

## 2022-03-09 MED ORDER — CITALOPRAM HYDROBROMIDE 10 MG PO TABS
10.0000 mg | ORAL_TABLET | Freq: Every day | ORAL | 1 refills | Status: DC
Start: 2022-03-09 — End: 2022-09-07

## 2022-03-09 MED ORDER — GABAPENTIN 100 MG PO CAPS: 100.0000 mg | ORAL_CAPSULE | Freq: Every day | ORAL | 3 refills | Status: DC

## 2022-03-09 MED ORDER — CYCLOBENZAPRINE HCL 10 MG PO TABS
10.0000 mg | ORAL_TABLET | Freq: Every evening | ORAL | 5 refills | Status: DC | PRN
Start: 2022-03-09 — End: 2022-08-03

## 2022-03-09 NOTE — Assessment & Plan Note (Addendum)
Bilateral hip pain is causing difficulty with sleep at night.  We are restarting her on gabapentin for her neuropathy in her feet.  That may help.  If not we could consider a trial of cyclobenzaprine which she has used previously.  I gave her prescription for both.  I would urge her not to try them both at the same time initially.  Follow-up 3 to 4 weeks.

## 2022-03-09 NOTE — Assessment & Plan Note (Signed)
She is already stopped the medication for a week.  Will have her take blood pressures at various times of the day over the next 2 to 3 weeks and I will see her back in 3 to 4 weeks.  We will reevaluate at that time based on her blood pressure readings.  Should she start to get continuously high readings, she will call me in the interim.

## 2022-03-09 NOTE — Progress Notes (Signed)
CHIEF COMPLAINT / HPI: Here for checkup.  Main issue is that she is feeling a little "lazy" because she is not getting as much accomplished since she retired as she thought she would.  She is also taking care of her granddaughter 10 to 12 hours a day 5 days a week.  Says she thinks the transition to retirement has just been a little more difficult than she thought it might be. 2.  Having a lot of joint pains particularly in her hips at night.  This keeps her awake much of the time. 3.  Having some continued neuropathic pain with sensation of needles sticking in her feet.  This is most bothersome at night.  She has started wearing some support hose which help this a little bit but she can only wear them for 2 to 3 hours before they become uncomfortable.  She has not been using gabapentin. 4.  Stuck herself with a piece of tin while she was in the garden and thinks she probably needs an updated tetanus shot. #5.  Questions about whether or not she needs continued medicine for her blood pressure.  Since she retired she thinks she has much less stress.  She is not taking it for the last 7 days and her pressures look good.  Wants to stop it if she can.   PERTINENT  PMH / PSH: I have reviewed the patient's medications, allergies, past medical and surgical history, smoking status and updated in the EMR as appropriate.   OBJECTIVE:  BP 128/82   Pulse 76   Ht '5\' 4"'$  (1.626 m)   Wt 178 lb 3.2 oz (80.8 kg)   SpO2 97%   BMI 30.59 kg/m  Vital signs reviewed. GENERAL: Well-developed, well-nourished, no acute distress. CARDIOVASCULAR: Regular rate and rhythm no murmur gallop or rub LUNGS: Clear to auscultation bilaterally, no rales or wheeze. ABDOMEN: Soft positive bowel sounds NEURO: No gross focal neurological deficits.  Very slight decrease sensation to soft touch bilateral feet. MSK: Movement of extremity x 4. PSYCH: AxOx4. Good eye contact.. No psychomotor retardation or agitation. Appropriate  speech fluency and content. Asks and answers questions appropriately. Mood is congruent. skin: Area of the small garden injury on the hand is without any sign of infection.  ASSESSMENT / PLAN:   Essential hypertension She is already stopped the medication for a week.  Will have her take blood pressures at various times of the day over the next 2 to 3 weeks and I will see her back in 3 to 4 weeks.  We will reevaluate at that time based on her blood pressure readings.  Should she start to get continuously high readings, she will call me in the interim.  Well adult exam Discussed exercise.  She started doing some chair yoga.  Encouraged her to continue to advance her exercise program.  She is on Prempro for hormone replacement therapy which should protect her some from osteoporosis.  Tetanus shot is updated today.  She has updated all of her fluid COVID vaccines previously.  Hip pain Bilateral hip pain is causing difficulty with sleep at night.  We are restarting her on gabapentin for her neuropathy in her feet.  That may help.  If not we could consider a trial of cyclobenzaprine which she has used previously.  I gave her prescription for both.  I would urge her not to try them both at the same time initially.  Follow-up 3 to 4 weeks.  ADJ DISORDER WITH  [place] with transition complicated by some feelings of not doing enough.  We discussed.  Will increase her citalopram to 30 mg.  She had been on this dose a couple of years ago and it did well for her.  Follow-up 3 to 4 weeks.  Continue to advance exercise program.  We also discussed potentially having her granddaughter sit with her other grandmother 1 or 2 days a week to give Echo some time to accomplish things.  Also discussed setting reasonable goals.   [name] MD

## 2022-03-09 NOTE — Assessment & Plan Note (Signed)
Discussed exercise.  She started doing some chair yoga.  Encouraged her to continue to advance her exercise program.  She is on Prempro for hormone replacement therapy which should protect her some from osteoporosis.  Tetanus shot is updated today.  She has updated all of her fluid COVID vaccines previously.

## 2022-03-09 NOTE — Patient Instructions (Addendum)
[email or web address]   Look at this web site and we will discuss vaccinations when you see me next month  Take some blood pressure readings at various times of the day.  Is very important that they are at different times and write them down.  Bring those with you when I see you back in a month.  We will also talk about vaccinations for your upcoming [place] trip then.  I have given you a tetanus shot today.  I will send you note about your blood work.  Great to see you!

## 2022-03-09 NOTE — Assessment & Plan Note (Signed)
Retirement with transition complicated by some feelings of not doing enough.  We discussed.  Will increase her citalopram to 30 mg.  She had been on this dose a couple of years ago and it did well for her.  Follow-up 3 to 4 weeks.  Continue to advance exercise program.  We also discussed potentially having her granddaughter sit with her other grandmother 1 or 2 days a week to give [name] some time to accomplish things.  Also discussed setting reasonable goals.

## 2022-04-06 ENCOUNTER — Ambulatory Visit: Payer: 59 | Admitting: Family Medicine

## 2022-04-13 ENCOUNTER — Ambulatory Visit (INDEPENDENT_AMBULATORY_CARE_PROVIDER_SITE_OTHER): Payer: 59 | Admitting: Family Medicine

## 2022-04-13 ENCOUNTER — Encounter: Payer: Self-pay | Admitting: Family Medicine

## 2022-04-13 VITALS — BP 126/72 | HR 74 | Ht 64.0 in | Wt 187.4 lb

## 2022-04-13 DIAGNOSIS — I10 Essential (primary) hypertension: Secondary | ICD-10-CM | POA: Diagnosis not present

## 2022-04-13 DIAGNOSIS — I70219 Atherosclerosis of native arteries of extremities with intermittent claudication, unspecified extremity: Secondary | ICD-10-CM | POA: Diagnosis not present

## 2022-04-13 DIAGNOSIS — G629 Polyneuropathy, unspecified: Secondary | ICD-10-CM

## 2022-04-13 DIAGNOSIS — K5909 Other constipation: Secondary | ICD-10-CM

## 2022-04-13 DIAGNOSIS — R6889 Other general symptoms and signs: Secondary | ICD-10-CM

## 2022-04-13 DIAGNOSIS — F4323 Adjustment disorder with mixed anxiety and depressed mood: Secondary | ICD-10-CM | POA: Diagnosis not present

## 2022-04-13 NOTE — Patient Instructions (Signed)
Add a small amount of fiber daily. If not improving in constipation, we can adjust the Linzess. Let me see you in 4-6 weeks.  Lets continue the citalopram at 30 mg. Lets stay OFF the BP medicine. I am sending you for a formal lower extremity vascular study.  Let me see you in 4-6 weeks.

## 2022-04-14 LAB — COMPREHENSIVE METABOLIC PANEL
ALT: 7 IU/L (ref 0–32)
AST: 20 IU/L (ref 0–40)
Albumin/Globulin Ratio: 1.8 (ref 1.2–2.2)
Albumin: 3.1 g/dL — ABNORMAL LOW (ref 3.9–4.9)
Alkaline Phosphatase: 47 IU/L (ref 44–121)
BUN/Creatinine Ratio: 22 (ref 12–28)
BUN: 19 mg/dL (ref 8–27)
Bilirubin Total: <0.2 mg/dL (ref 0.0–1.2)
CO2: 24 mmol/L (ref 20–29)
Calcium: 8.5 mg/dL — ABNORMAL LOW (ref 8.7–10.3)
Chloride: 107 mmol/L — ABNORMAL HIGH (ref 96–106)
Creatinine, Ser: 0.85 mg/dL (ref 0.57–1.00)
Globulin, Total: 1.7 g/dL (ref 1.5–4.5)
Glucose: 101 mg/dL — ABNORMAL HIGH (ref 70–99)
Potassium: 4.5 mmol/L (ref 3.5–5.2)
Sodium: 141 mmol/L (ref 134–144)
Total Protein: 4.8 g/dL — ABNORMAL LOW (ref 6.0–8.5)
eGFR: 76 mL/min/{1.73_m2} (ref >59)

## 2022-04-14 LAB — LDL CHOLESTEROL, DIRECT: LDL Direct: 133 mg/dL — ABNORMAL HIGH (ref 0–99)

## 2022-04-15 ENCOUNTER — Ambulatory Visit (HOSPITAL_COMMUNITY)
Admission: RE | Admit: 2022-04-15 | Discharge: 2022-04-15 | Disposition: A | Discharge status: Home / Self Care | Payer: 59 | Source: Ambulatory Visit | Attending: Vascular Surgery | Admitting: Vascular Surgery

## 2022-04-15 DIAGNOSIS — I70219 Atherosclerosis of native arteries of extremities with intermittent claudication, unspecified extremity: Secondary | ICD-10-CM | POA: Insufficient documentation

## 2022-04-15 DIAGNOSIS — R6889 Other general symptoms and signs: Secondary | ICD-10-CM | POA: Insufficient documentation

## 2022-04-15 DIAGNOSIS — G629 Polyneuropathy, unspecified: Secondary | ICD-10-CM | POA: Insufficient documentation

## 2022-04-15 LAB — VAS US ABI WITH/WO TBI
Left ABI: 1.19
Right ABI: 1.19

## 2022-04-15 NOTE — Progress Notes (Signed)
    CHIEF COMPLAINT / HPI: #1.  Follow-up on increasing dose of Celexa.  This is made a significant difference.  She feels much better. 2.  A nurse came out to her house from her insurance company and they did some type of screening test for vascular issues in her lower legs.  She brings a paper from them with a left of 0.18 in her right is 1.03.  I am not sure if this is an ABI or some other type of test. 3.  Still constipated.  The Linzess does not seem to be helping as much as it did.  She is not doing any regular fiber currently. 4.  Follow-up peripheral neuropathy.  We started gabapentin for her tingling in her lower extremity and that seems to help quite a bit is also helping her sleep.     PERTINENT  PMH / PSH: I have reviewed the patient's medications, allergies, past medical and surgical history, smoking status and updated in the EMR as appropriate.   OBJECTIVE:  BP 126/72   Pulse 74   Ht 5' 4"$  (1.626 m)   Wt 187 lb 6.4 oz (85 kg)   SpO2 98%   BMI 32.17 kg/m   PSYCH: AxOx4. Good eye contact.. No psychomotor retardation or agitation. Appropriate speech fluency and content. Asks and answers questions appropriately. Mood is congruent. EXT No edema Pedal pulses palpable bilaterally ASSESSMENT / PLAN:   ADJ DISORDER WITH MIXED ANXIETY & DEPRESSED MOOD Significant improvement with small increase in citalopram.  Will continue this dose and follow this up in 4 to 6 weeks.  Urged her to continue trying to get more exercise.  Chronic constipation Will add a little bit of fiber daily.  I would start it 1/3-1/2 dose but do it every day.  Over-the-counter Citrucel or Metamucil or MiraLAX are all good.  Continue Linzess current dose.  If she is not having some significant improvement when I see her back we could either increase fiber more or increase Linzess dosage.  Alternatively we could consider different type medicine similar to Linzess.  Neuropathy Had some paresthesias in her lower  extremity.  Has responded well to gabapentin.  She also now has results from some type of screening test that are little concerning for possible peripheral arterial disease.  Abnormal ankle brachial index (ABI) Screening test with results that are somewhat concerning.  Will refer her for formal ABI and reflex to arterial venous Dopplers as needed.  We have scheduled her an appointment for this Friday.   [name] MD

## 2022-04-15 NOTE — Assessment & Plan Note (Signed)
Significant improvement with small increase in citalopram.  Will continue this dose and follow this up in 4 to 6 weeks.  Urged her to continue trying to get more exercise.

## 2022-04-15 NOTE — Assessment & Plan Note (Signed)
Had some paresthesias in her lower extremity.  Has responded well to gabapentin.  She also now has results from some type of screening test that are little concerning for possible peripheral arterial disease.

## 2022-04-15 NOTE — Assessment & Plan Note (Signed)
Screening test with results that are somewhat concerning.  Will refer her for formal ABI and reflex to arterial venous Dopplers as needed.  We have scheduled her an appointment for this Friday.

## 2022-04-15 NOTE — Assessment & Plan Note (Signed)
Will add a little bit of fiber daily.  I would start it 1/3-1/2 dose but do it every day.  Over-the-counter Citrucel or Metamucil or MiraLAX are all good.  Continue Linzess current dose.  If she is not having some significant improvement when I see her back we could either increase fiber more or increase Linzess dosage.  Alternatively we could consider different type medicine similar to Linzess.

## 2022-04-20 ENCOUNTER — Encounter: Payer: Self-pay | Admitting: Family Medicine

## 2022-04-27 ENCOUNTER — Other Ambulatory Visit: Payer: Self-pay | Admitting: Family Medicine

## 2022-06-01 ENCOUNTER — Encounter: Payer: Self-pay | Admitting: Family Medicine

## 2022-06-01 ENCOUNTER — Ambulatory Visit (INDEPENDENT_AMBULATORY_CARE_PROVIDER_SITE_OTHER): Payer: 59 | Admitting: Family Medicine

## 2022-06-01 VITALS — BP 124/82 | HR 82 | Wt 182.8 lb

## 2022-06-01 DIAGNOSIS — F4323 Adjustment disorder with mixed anxiety and depressed mood: Secondary | ICD-10-CM

## 2022-06-01 DIAGNOSIS — I10 Essential (primary) hypertension: Secondary | ICD-10-CM | POA: Diagnosis not present

## 2022-06-01 DIAGNOSIS — M25511 Pain in right shoulder: Secondary | ICD-10-CM

## 2022-06-01 DIAGNOSIS — R6889 Other general symptoms and signs: Secondary | ICD-10-CM

## 2022-06-01 MED ORDER — MEFLOQUINE HCL 250 MG PO TABS: 250.0000 mg | ORAL_TABLET | ORAL | 0 refills | Status: DC

## 2022-06-01 NOTE — Assessment & Plan Note (Signed)
Currently doing extremely well on no medication.  Will continue to monitor.

## 2022-06-01 NOTE — Assessment & Plan Note (Signed)
Will continue current dose of SSRI as she seems to be in the good place with that.

## 2022-06-01 NOTE — Patient Instructions (Addendum)
I will send in your malaria prevention medicine. You will take a pill a week starting the week BEFORE you leave and take for a total of 6 weeks.  Have a great time! Get some compression socks. Walk while on the plane ride a few minutes every hour.  [email or web address]

## 2022-06-01 NOTE — Assessment & Plan Note (Signed)
Reviewed these reassuring results with her.  No intervention necessary.

## 2022-06-01 NOTE — Progress Notes (Signed)
    CHIEF COMPLAINT / HPI: #1.  Continues to have lower extremity edema that improves overnight.  She is concerned this will set her up for a lower extremity DVT when she travels to [place] in May.  She is going for mission trip. 2.  Wants to discuss travel safety including necessary vaccinations medications. 3.  Follow-up recent increase in SSRI.  She feels like she is doing pretty well right now.  Currently spends most of her time taking care of her grandbaby and she is happy with this. #4.  Wants to get a shot in her right shoulder.  She reached out to catch her granddaughter the other day and tweaked something in the shoulder and since then it has been really aching.  Hurts worse with overhead movements.  Similar to issues she has had before that responded to corticosteroid injections. Wants to review her labs and her recent lower extremity arterial Dopplers.   PERTINENT  PMH / PSH: I have reviewed the patient's medications, allergies, past medical and surgical history, smoking status and updated in the EMR as appropriate.   OBJECTIVE:  BP 124/82   Pulse 82   Wt 182 lb 12.8 oz (82.9 kg)   SpO2 99%   BMI 31.38 kg/m   PSYCH: AxOx4. Good eye contact.. No psychomotor retardation or agitation. Appropriate speech fluency and content. Asks and answers questions appropriately. Mood is congruent. SHOULDER: Right: Full range of motion.  Pain with resisted supraspinatus testing.  Distally neurovascularly intact. PROCEDURE: INJECTION: Patient was given informed consent, signed copy in the chart. Appropriate time out was taken. Area prepped and draped in usual sterile fashion. Ethyl chloride was  used for local anesthesia. A 21 gauge 1 1/2 inch needle was used..  1 cc of methylprednisolone 40 mg/ml plus 4 cc of 1% lidocaine without epinephrine was injected into the right subacromial bursa using a(n) posterior approach.   The patient tolerated the procedure well. There were no complications. Post  procedure instructions were given.   ASSESSMENT / PLAN: For shoulder pain today we gave her corticosteroid injection into the subacromial bursa.  Will see how this does for her   Essential hypertension Currently doing extremely well on no medication.  Will continue to monitor.  ADJ DISORDER WITH MIXED ANXIETY & DEPRESSED MOOD Will continue current dose of SSRI as she seems to be in the good place with that.  Abnormal ankle brachial index (ABI) Reviewed these reassuring results with her.  No intervention necessary.   Denny Levy MD

## 2022-06-20 ENCOUNTER — Other Ambulatory Visit: Payer: Self-pay | Admitting: Family Medicine

## 2022-07-22 ENCOUNTER — Other Ambulatory Visit: Payer: Self-pay | Admitting: Family Medicine

## 2022-07-22 DIAGNOSIS — I10 Essential (primary) hypertension: Secondary | ICD-10-CM

## 2022-08-03 ENCOUNTER — Other Ambulatory Visit: Payer: Self-pay

## 2022-08-03 MED ORDER — CYCLOBENZAPRINE HCL 10 MG PO TABS: 10.0000 mg | ORAL_TABLET | Freq: Every evening | ORAL | 5 refills | Status: DC | PRN

## 2022-08-15 ENCOUNTER — Other Ambulatory Visit: Payer: Self-pay | Admitting: Family Medicine

## 2022-09-02 ENCOUNTER — Ambulatory Visit: Payer: 59 | Admitting: Family Medicine

## 2022-09-07 ENCOUNTER — Other Ambulatory Visit: Payer: Self-pay | Admitting: Family Medicine

## 2022-09-07 MED ORDER — CITALOPRAM HYDROBROMIDE 20 MG PO TABS: 20.0000 mg | ORAL_TABLET | Freq: Every day | ORAL | 3 refills | Status: DC

## 2022-09-07 NOTE — Addendum Note (Signed)
Addended byDenny Levy L on: 09/07/2022 03:12 PM   Modules accepted: Orders

## 2022-09-16 ENCOUNTER — Other Ambulatory Visit: Payer: Self-pay | Admitting: Family Medicine

## 2022-09-16 ENCOUNTER — Encounter: Payer: Self-pay | Admitting: Family Medicine

## 2022-09-16 ENCOUNTER — Ambulatory Visit (INDEPENDENT_AMBULATORY_CARE_PROVIDER_SITE_OTHER): Payer: Medicare HMO | Admitting: Family Medicine

## 2022-09-16 VITALS — BP 98/76 | Ht 64.0 in | Wt 162.0 lb

## 2022-09-16 DIAGNOSIS — M25511 Pain in right shoulder: Secondary | ICD-10-CM | POA: Diagnosis not present

## 2022-09-16 DIAGNOSIS — M25512 Pain in left shoulder: Secondary | ICD-10-CM

## 2022-09-16 DIAGNOSIS — M25551 Pain in right hip: Secondary | ICD-10-CM | POA: Diagnosis not present

## 2022-09-16 DIAGNOSIS — M25552 Pain in left hip: Secondary | ICD-10-CM

## 2022-09-16 MED ORDER — METHYLPREDNISOLONE ACETATE 40 MG/ML IJ SUSP
40.0000 mg | Freq: Once | INTRAMUSCULAR | Status: AC
Start: 2022-09-16 — End: 2022-09-16
  Administered 2022-09-16: 40 mg via INTRA_ARTICULAR

## 2022-09-16 NOTE — Progress Notes (Signed)
  Paula Aguilar - 67 y.o. female MRN [id number]  Date of birth: March 20, 1955    SUBJECTIVE:      Chief Complaint:/ HPI:  Bilateral shoulder pain.  Would like to consider injections bilaterally.  Has been many months since she had an injection on the right but it did help for most of that time. #2.  Bilateral hip pain: This is gradually getting worse.  Bothers her both when she is walking and when she is sitting down and also starting to bother her when she lays down to go to sleep at night.  Generalized aching.  She is concerned it is arthritis that she has arthritis in many other areas.    OBJECTIVE: BP 98/76   Ht 5\' 4"  (1.626 m)   Wt 162 lb (73.5 kg)   BMI 27.81 kg/m   Physical Exam:  Vital signs are reviewed. General: Well-developed female no acute distress SHOULDERS: Bilateral shoulders are symmetrical.  She has full range of motion on the right, with significant pain in abduction above 90 degrees and with forward flexion above 110 degrees.  The left she has some pain with forward flexion above 120 degrees and abduction greater than 100 degrees.  Rotator cuff strength is 5 out of 5.  Distally she is neurovascularly intact.  Supraspinatus testing bilaterally is painful reproducing her pain. HIPS: Internal and external rotation is full and none painful.  She can rise from a chair without any assistance.  PROCEDURE: INJECTION: Patient was given informed consent, signed copy in the chart. Appropriate time out was taken. Area prepped and draped in usual sterile fashion. Ethyl chloride was  used for local anesthesia. A 21 gauge 1 1/2 inch needle was used..  1 cc of methylprednisolone 40 mg/ml plus 4 cc of 1% lidocaine without epinephrine was injected into the bilateral subacromial space of the shoulders using a(n) posterior approach.   The patient tolerated the procedure well. There were no complications. Post procedure instructions were given.  ASSESSMENT & PLAN:  See problem based charting  & AVS for pt instructions. No problem-specific Assessment & Plan notes found for this encounter.

## 2022-09-16 NOTE — Assessment & Plan Note (Signed)
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  Will get hip films and follow-up with her once those are read.

## 2022-09-16 NOTE — Assessment & Plan Note (Signed)
Bilateral corticosteroid injection today.  Home exercise program.  Follow-up as needed.

## 2022-09-22 ENCOUNTER — Ambulatory Visit
Admission: RE | Admit: 2022-09-22 | Discharge: 2022-09-22 | Disposition: A | Discharge status: Home / Self Care | Payer: 59 | Source: Ambulatory Visit | Attending: Family Medicine | Admitting: Family Medicine

## 2022-09-22 DIAGNOSIS — M25552 Pain in left hip: Secondary | ICD-10-CM

## 2022-10-14 ENCOUNTER — Encounter: Payer: Self-pay | Admitting: Family Medicine

## 2022-12-17 ENCOUNTER — Other Ambulatory Visit: Payer: Self-pay | Admitting: Family Medicine

## 2022-12-30 ENCOUNTER — Ambulatory Visit (INDEPENDENT_AMBULATORY_CARE_PROVIDER_SITE_OTHER): Payer: 59 | Admitting: Family Medicine

## 2022-12-30 VITALS — BP 116/84 | Ht 64.0 in | Wt 162.0 lb

## 2022-12-30 DIAGNOSIS — M25511 Pain in right shoulder: Secondary | ICD-10-CM

## 2022-12-30 MED ORDER — METHYLPREDNISOLONE ACETATE 40 MG/ML IJ SUSP
40.0000 mg | Freq: Once | INTRAMUSCULAR | Status: AC
  Administered 2022-12-30: 40 mg via INTRA_ARTICULAR

## 2022-12-31 ENCOUNTER — Encounter: Payer: Self-pay | Admitting: Family Medicine

## 2022-12-31 NOTE — Patient Instructions (Signed)
Today you received an injection with corticosteroid. This injection is usually done in response to pain and inflammation. There is some "numbing" medicine also in the shot so the injected area may be numb and feel really good for the next couple of hours. The numbing medicine usually wears off in 2-3 hours though, and then your pain level will be right back where it was before the injection.   The actually benefit from the steroid injection is usually noticed in 2-7 days. You may actually experience a small (as in 10%) INCREASE in pain in the first 24 hours---that is common.   Things to watch out for that you should contact us or a health care provider urgently would include: 1. Unusual (as in more than 10%) increase in pain 2. New fever > 101.5 3. New swelling or redness of the injected area.  4. Streaking of red lines around the area injected.  

## 2022-12-31 NOTE — Progress Notes (Signed)
  Paula Aguilar - 67 y.o. female MRN [id number]  Date of birth: 05-25-1955    SUBJECTIVE:      Chief Complaint:/ HPI:   Stepped into a hole in the ground while taking her grandchild trick or treating, falling onto right shoulder and hip. Did not lose consciousness and does not think she hit her head. Improving soreness in hip but  shoulder is aching 24/7 and 6/10/ Stiff if she tries to elevate above shoulder height. Worried she has done something serious to her shoulder as she has had issues long term with both, although recently pain had been under better control. No numbness in hand on right.    OBJECTIVE: BP 116/84   Ht 5\' 4"  (1.626 m)   Wt 162 lb (73.5 kg)   BMI 27.81 kg/m   Physical Exam:  Vital signs are reviewed. GEN WD WN NAD SHOULDERS: symmetrical. Right shoulder limited ABduction above 90 degrees AROM due to pain. Can passively achieve FROM. IR and ER normal ROM, relatively painless. Mild ttp bicep tendon but strength intact. No defect noted.  PROCEDURE: INJECTION: Patient was given informed consent, signed copy in the chart. Appropriate time out was taken. Area prepped and draped in usual sterile fashion. Ethyl chloride was  used for local anesthesia. A 21 gauge 1 1/2 inch needle was used.. 1 cc of methylprednisolone 40 mg/ml plus  4 cc of 1% lidocaine without epinephrine was injected into the right shoulder subacromial bursa using a(n) posterior approach.   The patient tolerated the procedure well. There were no complications. Post procedure instructions were given.    ASSESSMENT & PLAN: Right shoulder pain: bursitis aggravated by fall onto shoulder on Halloween. HEP is well known to her and we decided to do CSI today into subacromial bursa to see if we could attain some pain relief and allow her to proceed with HEP. If not improving in 2-4 weeks she will call for appt.  See problem based charting & AVS for pt instructions. No problem-specific Assessment & Plan notes found  for this encounter.

## 2023-03-15 ENCOUNTER — Other Ambulatory Visit: Payer: Self-pay | Admitting: Family Medicine

## 2023-04-26 ENCOUNTER — Other Ambulatory Visit: Payer: Self-pay | Admitting: Family Medicine

## 2023-05-03 ENCOUNTER — Other Ambulatory Visit: Payer: Self-pay | Admitting: Family Medicine

## 2023-06-30 ENCOUNTER — Ambulatory Visit (INDEPENDENT_AMBULATORY_CARE_PROVIDER_SITE_OTHER): Admitting: Family Medicine

## 2023-06-30 VITALS — BP 147/88 | Ht 64.0 in | Wt 185.0 lb

## 2023-06-30 DIAGNOSIS — M25559 Pain in unspecified hip: Secondary | ICD-10-CM | POA: Diagnosis not present

## 2023-06-30 DIAGNOSIS — M25512 Pain in left shoulder: Secondary | ICD-10-CM

## 2023-06-30 DIAGNOSIS — M25511 Pain in right shoulder: Secondary | ICD-10-CM | POA: Diagnosis not present

## 2023-06-30 DIAGNOSIS — M4722 Other spondylosis with radiculopathy, cervical region: Secondary | ICD-10-CM

## 2023-06-30 DIAGNOSIS — M5412 Radiculopathy, cervical region: Secondary | ICD-10-CM | POA: Diagnosis not present

## 2023-06-30 DIAGNOSIS — M25552 Pain in left hip: Secondary | ICD-10-CM

## 2023-06-30 MED ORDER — CYCLOBENZAPRINE HCL 10 MG PO TABS: 10.0000 mg | ORAL_TABLET | Freq: Every day | ORAL | 5 refills | Status: DC

## 2023-06-30 MED ORDER — METHYLPREDNISOLONE ACETATE 40 MG/ML IJ SUSP
40.0000 mg | Freq: Once | INTRAMUSCULAR | Status: AC
  Administered 2023-06-30: 40 mg via INTRA_ARTICULAR

## 2023-06-30 MED ORDER — GABAPENTIN 100 MG PO CAPS: ORAL_CAPSULE | ORAL | 1 refills | Status: DC

## 2023-06-30 NOTE — Progress Notes (Signed)
 PCP: Charise Companion, MD  Chief Complaint: Right shoulder pain, left hip pain Subjective:   HPI: Patient is a 68 y.o. female here for right shoulder pain.  Patient's been noticing right shoulder pain for a while now and notes that she usually gets injections every 3 months.  Patient also notes that she feels like some of her pain is starting her traps and radiates down into her hands.  Patient endorses numbness tingling of her hands as well.  Patient states that the pain right now is located in her traps as well as her shoulder.  Patient has some pain with abduction.  Patient also notes that she has been dealing with some pain over her left hip.  Patient states usually her right hip bothers her when her left foot is bothering her right now.  Patient states that the pain is located laterally over the trochanteric area.  Patient notes that she has sometimes pain in the anterior aspect of hip/groin but right now most the pain on the lateral aspect.    Past Medical History:  Diagnosis Date   Arthritis    Asthma    Basal cell carcinoma 08/15/2019   Left Chest Wall   BCC (basal cell carcinoma of skin) 2021   frontal scalp mohs   Blood transfusion without reported diagnosis 1976   post cesarean section   GERD (gastroesophageal reflux disease)    Hypertension    not requiring medication since 09/2010   Seasonal allergies    Sinusitis     Current Outpatient Medications on File Prior to Visit  Medication Sig Dispense Refill   ADVAIR DISKUS 250-50 MCG/DOSE AEPB TAKE 1 PUFF BY MOUTH TWICE A DAY 180 each 3   albuterol  (VENTOLIN  HFA) 108 (90 Base) MCG/ACT inhaler INHALE 2 PUFFS INTO THE LUNGS 2 (TWO) TIMES DAILY AS NEEDED FOR WHEEZING OR SHORTNESS OF BREATH. 8.5 each 12   budesonide -formoterol  (SYMBICORT ) 80-4.5 MCG/ACT inhaler One puff inhaled twice a day every day 1 Inhaler 12   citalopram  (CELEXA ) 10 MG tablet TAKE 1 TABLET (10 MG TOTAL) BY MOUTH DAILY. TAKE IN ADDTITION TO 20 MG TAB CITALOPRAM  90  tablet 1   citalopram  (CELEXA ) 20 MG tablet Take 1 tablet (20 mg total) by mouth daily. 90 tablet 3   cyclobenzaprine  (FLEXERIL ) 10 MG tablet TAKE 1 TABLET BY MOUTH AT BEDTIME AS NEEDED FOR MUSCLE SPASMS 30 tablet 5   furosemide  (LASIX ) 20 MG tablet TAKE ONE BY MOUTH DAILY AS DIRECTED FOR EDEMA 90 tablet 0   gabapentin  (NEURONTIN ) 100 MG capsule Take 1 capsule (100 mg total) by mouth at bedtime. 90 capsule 3   LINZESS  145 MCG CAPS capsule TAKE 1 CAPSULE DAILY BEFORE BREAKFAST. PLEASE SCHEDULE A YEARLY OFFICE VISIT FOR FURTHER REFILLS. 90 capsule 3   mefloquine  (LARIAM ) 250 MG tablet Take 1 tablet (250 mg total) by mouth every 7 (seven) days. 6 tablet 0   meloxicam  (MOBIC ) 15 MG tablet TAKE 1 TABLET BY MOUTH EVERY DAY 90 tablet 3   montelukast  (SINGULAIR ) 10 MG tablet TAKE 1 TABLET BY MOUTH EVERY DAY 90 tablet 3   omeprazole  (PRILOSEC) 40 MG capsule TAKE 1 CAPSULE BY MOUTH TWICE A DAY 180 capsule 3   PREMPRO  0.3-1.5 MG tablet TAKE 1 TABLET BY MOUTH EVERY DAY 84 tablet 3   No current facility-administered medications on file prior to visit.    Past Surgical History:  Procedure Laterality Date   AUGMENTATION MAMMAPLASTY     CESAREAN SECTION  CHOLECYSTECTOMY     COLONOSCOPY     KNEE ARTHROSCOPY     right side    No Known Allergies  BP (!) 147/88   Ht 5\' 4"  (1.626 m)   Wt 185 lb (83.9 kg)   BMI 31.76 kg/m      12/20/2019   11:32 AM 10/30/2020    8:27 AM 01/08/2021    9:33 AM  [facility]  Frequency of aerobic exercise (# of days/week) 5  2  Average time in minutes > 90 60 0  Frequency of strengthening activities (# of days/week) 0 0 0        No data to display              Objective:  Physical Exam:  Gen: NAD, comfortable in exam room  Inspection reveals no gross abnormalities of the right shoulder, there is no tenderness to palpation over the humeral head or bicipital groove.  Mild tenderness to palpation over the right trap.  Range of  motion the shoulder is full.  There are some noted pain against active abduction and active external rotation.  Empty can positive.  Neer's impingement negative.  Left hip: Inspection reveals no gross abnormalities.  There is tenderness palpation over the greater trochanter as well as over the gluteus medius and minimus.  FADIR is negative, FABER is positive.  No limited internal rotation or pain with internal rotation of the hip.  Passive logroll equal bilaterally.  Hip abduction noted with some pain against resistance on the left side as well as the right.   Assessment & Plan:  1. Cervical radiculitis (Primary) - Patient presenting with some cervical radiculitis at this time given numbness tingling down into the hands starting from the neck.  Will go ahead and get a get repeat x-rays.  Suspect patient will have worsening degenerative changes.  Will refill patient's Flexeril  at this time as well given trapezius spasms -Will go ahead and inject patient's shoulder today, patient usually gets some shoulder pain relief from subacromial injections. - Also suspect patient's left hip pain is likely related to greater trochanteric pain syndrome.  Would recommend injection into the left greater trochanteric bursa as well as left hip abduction strengthening. -Patient notes worsening neuropathy, will increase gabapentin  at this time to 200 mg nightly  Pain in joint of right shoulder   Greater trochanteric pain syndrome    Jude Norton MD, PGY-4  Sports Medicine Fellow [facility]

## 2023-07-01 NOTE — Progress Notes (Signed)
 [facility]: Attending Note: I have examined the patient, reviewed the chart, discussed the assessment and plan with the Sports Medicine Fellow. I agree with assessment and treatment plan as detailed in the Fellow's note. #1.  Right shoulder pain: Chronic recurrent subacromial bursitis.  She wants corticosteroid injection which we did today. 2.  I do think there could be component of radiculopathy from her neck.  Will update her cervical spine films as we have not had any for several years 3.  Hip pain: She has some pain with external rotation in seated position.  Also having some pain into the groin on the left.  She is tender to palpation over the greater trochanteric bursa so we did corticosteroid injection there today but I convince her to also get hip x-rays as she may be developing some early symptoms of osteoarthritis of the hip.  X-rays will be reviewed after they are done and I will call her with results. PROCEDURE: INJECTION: Patient was given informed consent, signed copy in the chart. Appropriate time out was taken. Area prepped and draped in usual sterile fashion. Ethyl chloride was  used for local anesthesia. A 21 gauge 1 1/2 inch needle was used..  1 cc of methylprednisolone  40 mg/ml plus 4 cc of 1% lidocaine without epinephrine was injected into the right subacromial bursa using a(n) posterior approach.  PROCEDURE: INJECTION: Patient was given informed consent, signed copy in the chart. Appropriate time out was taken. Area prepped and draped in usual sterile fashion. Ethyl chloride was  used for local anesthesia. A 21 gauge 1 1/2 inch needle was used..  1 cc of methylprednisolone  40 mg/ml plus 4 cc of 1% lidocaine without epinephrine was injected into the left greater trochanteric bursa of the left hip using a(n) lateral approach.   The patient tolerated the procedure well. There were no complications. Post procedure instructions were given.

## 2023-07-13 ENCOUNTER — Ambulatory Visit
Admission: RE | Admit: 2023-07-13 | Discharge: 2023-07-13 | Disposition: A | Discharge status: Home / Self Care | Source: Ambulatory Visit | Attending: Family Medicine | Admitting: Family Medicine

## 2023-07-13 DIAGNOSIS — M5412 Radiculopathy, cervical region: Secondary | ICD-10-CM

## 2023-07-13 DIAGNOSIS — M25552 Pain in left hip: Secondary | ICD-10-CM

## 2023-07-24 ENCOUNTER — Ambulatory Visit: Payer: Self-pay | Admitting: Family Medicine

## 2023-07-26 ENCOUNTER — Encounter: Payer: Self-pay | Admitting: Family Medicine

## 2023-07-26 ENCOUNTER — Ambulatory Visit (INDEPENDENT_AMBULATORY_CARE_PROVIDER_SITE_OTHER): Admitting: Family Medicine

## 2023-07-26 VITALS — BP 132/84 | HR 100 | Ht 64.0 in | Wt 190.8 lb

## 2023-07-26 DIAGNOSIS — Z0000 Encounter for general adult medical examination without abnormal findings: Secondary | ICD-10-CM

## 2023-07-26 DIAGNOSIS — M5410 Radiculopathy, site unspecified: Secondary | ICD-10-CM

## 2023-07-26 DIAGNOSIS — E66811 Obesity, class 1: Secondary | ICD-10-CM

## 2023-07-26 DIAGNOSIS — M542 Cervicalgia: Secondary | ICD-10-CM | POA: Diagnosis not present

## 2023-07-26 DIAGNOSIS — I10 Essential (primary) hypertension: Secondary | ICD-10-CM

## 2023-07-26 DIAGNOSIS — Z6832 Body mass index (BMI) 32.0-32.9, adult: Secondary | ICD-10-CM

## 2023-07-26 DIAGNOSIS — G8929 Other chronic pain: Secondary | ICD-10-CM

## 2023-07-26 DIAGNOSIS — E669 Obesity, unspecified: Secondary | ICD-10-CM

## 2023-07-26 MED ORDER — GABAPENTIN 300 MG PO CAPS
300.0000 mg | ORAL_CAPSULE | Freq: Every day | ORAL | 3 refills | Status: AC
Start: 2023-07-26 — End: ?

## 2023-07-26 MED ORDER — LINACLOTIDE 145 MCG PO CAPS: 145.0000 ug | ORAL_CAPSULE | Freq: Every day | ORAL | 3 refills | Status: AC

## 2023-07-26 MED ORDER — PREMPRO 0.3-1.5 MG PO TABS: 1.0000 | ORAL_TABLET | Freq: Every day | ORAL | 3 refills | Status: AC

## 2023-07-26 MED ORDER — ALBUTEROL SULFATE HFA 108 (90 BASE) MCG/ACT IN AERS: 2.0000 | INHALATION_SPRAY | Freq: Two times a day (BID) | RESPIRATORY_TRACT | 12 refills | Status: AC | PRN

## 2023-07-26 MED ORDER — SEMAGLUTIDE-WEIGHT MANAGEMENT 0.25 MG/0.5ML ~~LOC~~ SOAJ: 0.2500 mg | SUBCUTANEOUS | 1 refills | Status: DC

## 2023-07-26 MED ORDER — CITALOPRAM HYDROBROMIDE 40 MG PO TABS: 40.0000 mg | ORAL_TABLET | Freq: Every day | ORAL | 3 refills | Status: AC

## 2023-07-26 MED ORDER — CYCLOBENZAPRINE HCL 10 MG PO TABS: 10.0000 mg | ORAL_TABLET | Freq: Every day | ORAL | 5 refills | Status: AC

## 2023-07-26 NOTE — Patient Instructions (Addendum)
 Please set up your mammogram! Let's see if your insurance will pay for this. We talked about cutting out your sugary drinks and doing some daily walking Let me see you in about 4 weeks and we can remove the mole and do a check in on your weight management.

## 2023-07-27 DIAGNOSIS — G8929 Other chronic pain: Secondary | ICD-10-CM | POA: Insufficient documentation

## 2023-07-27 DIAGNOSIS — E669 Obesity, unspecified: Secondary | ICD-10-CM | POA: Insufficient documentation

## 2023-07-27 DIAGNOSIS — M5410 Radiculopathy, site unspecified: Secondary | ICD-10-CM | POA: Insufficient documentation

## 2023-07-27 LAB — COMPREHENSIVE METABOLIC PANEL WITH GFR
ALT: 11 IU/L (ref 0–32)
AST: 23 IU/L (ref 0–40)
Albumin: 3.4 g/dL — ABNORMAL LOW (ref 3.9–4.9)
Alkaline Phosphatase: 65 IU/L (ref 44–121)
BUN/Creatinine Ratio: 22 (ref 12–28)
BUN: 20 mg/dL (ref 8–27)
Bilirubin Total: <0.2 mg/dL (ref 0.0–1.2)
CO2: 24 mmol/L (ref 20–29)
Calcium: 9.3 mg/dL (ref 8.7–10.3)
Chloride: 108 mmol/L — ABNORMAL HIGH (ref 96–106)
Creatinine, Ser: 0.92 mg/dL (ref 0.57–1.00)
Globulin, Total: 1.8 g/dL (ref 1.5–4.5)
Glucose: 104 mg/dL — ABNORMAL HIGH (ref 70–99)
Potassium: 4.8 mmol/L (ref 3.5–5.2)
Sodium: 147 mmol/L — ABNORMAL HIGH (ref 134–144)
Total Protein: 5.2 g/dL — ABNORMAL LOW (ref 6.0–8.5)
eGFR: 69 mL/min/{1.73_m2} (ref >59)

## 2023-07-27 LAB — LIPID PANEL
Chol/HDL Ratio: 3.2 ratio (ref 0.0–4.4)
Cholesterol, Total: 200 mg/dL — ABNORMAL HIGH (ref 100–199)
HDL: 63 mg/dL (ref >39)
LDL Chol Calc (NIH): 109 mg/dL — ABNORMAL HIGH (ref 0–99)
Triglycerides: 165 mg/dL — ABNORMAL HIGH (ref 0–149)
VLDL Cholesterol Cal: 28 mg/dL (ref 5–40)

## 2023-07-27 LAB — VITAMIN D 25 HYDROXY (VIT D DEFICIENCY, FRACTURES): Vit D, 25-Hydroxy: 27.1 ng/mL — ABNORMAL LOW (ref 30.0–100.0)

## 2023-07-27 NOTE — Assessment & Plan Note (Signed)
 She has had a long history of chronic neck pain and now she has what is apparently a right upper extremity and right upper back radiculopathy with abnormal x-ray findings.  We discussed options and she will Serpe with MRI which I will set up.

## 2023-07-27 NOTE — Assessment & Plan Note (Signed)
Good control.  Will continue current medications.

## 2023-07-27 NOTE — Assessment & Plan Note (Signed)
 MRI

## 2023-07-27 NOTE — Assessment & Plan Note (Signed)
 Discussed health maintenance issues We also discussed lifestyle modifications including decreasing sugar intake especially in sodas, trying to get back on the exercise wagon.  She used to walk the dog more often and for longer period she is currently taking care of her grandchildren and quite busy doing that.  She has a new grandchild on the way.  Says she will likely be doing some caretaking of that child as well.  These activities keep her busy but they are not really aerobic exercise or continuous exercise.

## 2023-07-27 NOTE — Assessment & Plan Note (Addendum)
 Patient's BMI: 32.9 places patient in category of class I >25 <30 = overweight  >30< 35 = Class 1 obesity or mildly obese   >35 <40 = Class 2 obesity or moderately obese  > 40 = Class 3 obesity or severe obesity  Diet: Patient is currently in the behavioral change stage of (precontemplation, contemplation, preparation, action, maintenance, or relapse) has already made some dietary changes and is willing to make more.  Goals.  Patient's goal is to (initiate, continue, or increase)  weight loss efforts.  Increase exercise as well as lose weight  Behavior change strategies discussed today practicing  portion control   making smarter food choices increasing vegetables in the diet, especially leafy greens  decreasing simple carbohydrates increasing lean protein intake avoid skipping meals  discussed better snacking choices Avoid sugary drinks including bottled iced tea  Exercise: We again discussed that exercise and physical activity is an essential part of a healthy lifestyle. Today it was re-iterated that some exercise is better than none Opportunities to add walking steps include simple changes such as parking farther away from your destination Goal is for a minimum of 150 minutes of moderate intensity aerobic activity per week Activity should occur on at least 3 days out of 7 Previously had done a lot of walking but now has curtailed that.  Recommend she get back to daily walking least 30 minutes if possible.  Follow up: 1 month

## 2023-07-27 NOTE — Progress Notes (Signed)
 CHIEF COMPLAINT / HPI: Here for well adult checkup.  Has a couple things she wants me to look at.  #1 is a mole left upper back.  Itchy.  Not sure how long it has been there. 2.  Has had some increase in weight gain.  Admits to drinking probably more sodas and bottled iced tea that may or may not contain sugar and she probably should. 3.  Follow-up upper back pain and right arm radiculopathy, review x-rays.   PERTINENT  PMH / PSH: I have reviewed the patient's medications, allergies, past medical and surgical history, smoking status and updated in the EMR as appropriate.   OBJECTIVE:  BP 132/84   Pulse 100   Ht 5\' 4"  (1.626 m)   Wt 190 lb 12.8 oz (86.5 kg)   SpO2 98%   BMI 32.75 kg/m  Vital signs reviewed. GENERAL: Well-developed, well-nourished, no acute distress. CARDIOVASCULAR: Regular rate and rhythm no murmur gallop or rub LUNGS: Clear to auscultation bilaterally, no rales or wheeze. ABDOMEN: Soft positive bowel sounds MSK: Movement of extremity x 4. PSYCH: AxOx4. Good eye contact.. No psychomotor retardation or agitation. Appropriate speech fluency and content. Asks and answers questions appropriately. Mood is congruent.    ASSESSMENT / PLAN:   Well adult exam Discussed health maintenance issues We also discussed lifestyle modifications including decreasing sugar intake especially in sodas, trying to get back on the exercise wagon.  She used to walk the dog more often and for longer period she is currently taking care of her grandchildren and quite busy doing that.  She has a new grandchild on the way.  Says she will likely be doing some caretaking of that child as well.  These activities keep her busy but they are not really aerobic exercise or continuous exercise.    Obesity (BMI 35.0-39.9 without comorbidity) Patient's BMI: 32.9 places patient in category of class I >25 <30 = overweight  >30< 35 = Class 1 obesity or mildly obese   >35 <40 = Class 2 obesity or  moderately obese  > 40 = Class 3 obesity or severe obesity  Diet: Patient is currently in the behavioral change stage of (precontemplation, contemplation, preparation, action, maintenance, or relapse) has already made some dietary changes and is willing to make more.  Goals.  Patient's goal is to (initiate, continue, or increase)  weight loss efforts.  Increase exercise as well as lose weight  Behavior change strategies discussed today practicing  portion control   making smarter food choices increasing vegetables in the diet, especially leafy greens  decreasing simple carbohydrates increasing lean protein intake avoid skipping meals  discussed better snacking choices Avoid sugary drinks including bottled iced tea  Exercise: We again discussed that exercise and physical activity is an essential part of a healthy lifestyle. Today it was re-iterated that some exercise is better than none Opportunities to add walking steps include simple changes such as parking farther away from your destination Goal is for a minimum of 150 minutes of moderate intensity aerobic activity per week Activity should occur on at least 3 days out of 7 Previously had done a lot of walking but now has curtailed that.  Recommend she get back to daily walking least 30 minutes if possible.  Follow up: 1 month  Essential hypertension Good control.  Will continue current medications.  Chronic neck pain She has had a long history of chronic neck pain and now she has what is apparently a right upper extremity  and right upper back radiculopathy with abnormal x-ray findings.  We discussed options and she will Serpe with MRI which I will set up.  Radiculopathy affecting upper extremity MRI   Paula Grice MD

## 2023-07-31 ENCOUNTER — Ambulatory Visit: Payer: Self-pay | Admitting: Family Medicine

## 2023-07-31 ENCOUNTER — Other Ambulatory Visit (HOSPITAL_COMMUNITY): Payer: Self-pay

## 2023-07-31 ENCOUNTER — Other Ambulatory Visit: Payer: Self-pay

## 2023-07-31 MED ORDER — FUROSEMIDE 20 MG PO TABS: ORAL_TABLET | ORAL | 0 refills | Status: DC

## 2023-07-31 NOTE — Progress Notes (Signed)
 Your labs look pretty good.  Your total cholesterol is a little high but you have a great HDL and your LDL is in an okay range as well so I would not do a thing different.  Your total protein is a little low so make sure you are getting plenty of protein in your diet.  Everything else looks fine.

## 2023-08-01 ENCOUNTER — Ambulatory Visit (HOSPITAL_COMMUNITY)

## 2023-08-02 ENCOUNTER — Telehealth: Payer: Self-pay

## 2023-08-02 NOTE — Telephone Encounter (Signed)
 Pharmacy Patient Advocate Encounter   Received notification from CoverMyMeds that prior authorization for WEGOVY  0.25MG  is required/requested.   Insurance verification completed.   The patient is insured through [employer] .   PA required; PA submitted to above mentioned insurance via Fax Key/confirmation #/EOC N/A Status is pending

## 2023-08-03 ENCOUNTER — Ambulatory Visit (HOSPITAL_COMMUNITY)

## 2023-08-04 ENCOUNTER — Other Ambulatory Visit (HOSPITAL_COMMUNITY): Payer: Self-pay

## 2023-08-04 NOTE — Telephone Encounter (Signed)
 Pharmacy Patient Advocate Encounter  Received notification from [facility] MEDICARE that Prior Authorization for WEGOVY  has been APPROVED from 08/03/23 to 03/04/24. Unable to obtain price due to refill too soon rejection, last fill date 08/01/23 next available fill date7/1/25    Full explanation letter scanned to patients media.

## 2023-08-08 ENCOUNTER — Encounter: Payer: Self-pay | Admitting: Family Medicine

## 2023-08-30 ENCOUNTER — Other Ambulatory Visit: Payer: Self-pay | Admitting: Family Medicine

## 2023-09-06 ENCOUNTER — Encounter: Payer: Self-pay | Admitting: Family Medicine

## 2023-09-06 ENCOUNTER — Ambulatory Visit (INDEPENDENT_AMBULATORY_CARE_PROVIDER_SITE_OTHER): Admitting: Family Medicine

## 2023-09-06 VITALS — BP 133/92 | HR 72 | Wt 180.0 lb

## 2023-09-06 DIAGNOSIS — K5909 Other constipation: Secondary | ICD-10-CM

## 2023-09-06 DIAGNOSIS — I10 Essential (primary) hypertension: Secondary | ICD-10-CM

## 2023-09-06 DIAGNOSIS — L570 Actinic keratosis: Secondary | ICD-10-CM

## 2023-09-06 DIAGNOSIS — M5410 Radiculopathy, site unspecified: Secondary | ICD-10-CM

## 2023-09-06 MED ORDER — LACTULOSE 10 G PO PACK: 10.0000 g | PACK | Freq: Two times a day (BID) | ORAL | 3 refills | Status: DC

## 2023-09-06 NOTE — Patient Instructions (Addendum)
 Today I removed 2 neoplasms from your back.  1 was an irritated seborrheic keratosis the other was was likely a benign nevus.  No need to send these off for pathology.  Please leave the Band-Aid on for 30 minutes or so then you can take it off.  Let me see you back in about 6 weeks and 6 weeks and we can talk about rescheduling the MRI.  I have sent in the laxative in the lactulose  for you.  I will see if we can figure out why the insurance did not pay for the Linzess .

## 2023-09-06 NOTE — Progress Notes (Unsigned)
    CHIEF COMPLAINT / HPI: #1.  Constipation: Her insurance denied Linzess  even though she had been on that previously.  She has been trying over-the-counter fiber supplements with no improvement.  Feels bloated. 2.  Insurance also that not her MRI of her neck.  She is pretty frustrated about that.  Continues to have radicular pain in her upper extremity on the right.  We had started her on some neck stretching exercises, and she wonders what else she could possibly do to improve the symptoms. 3.  Wants me to evaluate and possibly remove that lesions on her back. 4. anxious depression symptoms about the same.  Since starting the medication she is about 50% better but since last visit she has not changed much.   PERTINENT  PMH / PSH: I have reviewed the patient's medications, allergies, past medical and surgical history, smoking status and updated in the EMR as appropriate.   OBJECTIVE:  BP (!) 133/92   Pulse 72   Wt 180 lb (81.6 kg)   SpO2 98%   BMI 30.90 kg/m  PSYCH: AxOx4. Good eye contact.. No psychomotor retardation or agitation. Appropriate speech fluency and content. Asks and answers questions appropriately. Mood is congruent. SKIN: On the back she has a 6 mm papular actinic keratoses that is quite irritated looking.  Procedure note: Patient given informed consent for shave removal of irritated actinic keratosis on her back.  She would also like to have a second benign appearing nevus removed.  Signed copy of the informed consent is in the chart.  Area did prepped and draped in usual sterile fashion.  Blade used to shave both lesions off.  There was minimal bleeding.  Band-Aid applied.  Postprocedure instructions given.  Do not feel like this needs pathology evaluation.  ASSESSMENT / PLAN: #1.  Seborrheic keratosis #2 benign skin lesion: Shave excision today.  Both appear benign but they are in the bra line in the back and quite irritating to her.  Essential hypertension Good  control of her blood pressure at this time.  Will continue current medications.  We do not need lab work at this moment  Chronic constipation Unfortunate that her insurance will not continue that Linzess .  Will try lactulose  and she will connect with me in about 2 weeks to let me know how she is doing.  Alternatively we could appeal insurance decision.  Radiculopathy affecting upper extremity Right upper extremity radiculopathy, multifactorial likely.  I think we may have underappreciated the component from the arthritis in her neck.  I would like to get an MRI but that was denied.  We had started her on stretching exercises.  I will now start her on a full home exercise program for neck rehabilitation.  She will follow-up in 4 weeks.  Continue ice, continue modifying for appropriate cervical spine posture, strengthening of shoulder girdle as well.   Camie Mulch MD

## 2023-09-07 NOTE — Assessment & Plan Note (Signed)
 Right upper extremity radiculopathy, multifactorial likely.  I think we may have underappreciated the component from the arthritis in her neck.  I would like to get an MRI but that was denied.  We had started her on stretching exercises.  I will now start her on a full home exercise program for neck rehabilitation.  She will follow-up in 4 weeks.  Continue ice, continue modifying for appropriate cervical spine posture, strengthening of shoulder girdle as well.

## 2023-09-07 NOTE — Assessment & Plan Note (Signed)
 Unfortunate that her insurance will not continue that Linzess .  Will try lactulose  and she will connect with me in about 2 weeks to let me know how she is doing.  Alternatively we could appeal insurance decision.

## 2023-09-07 NOTE — Assessment & Plan Note (Signed)
 Good control of her blood pressure at this time.  Will continue current medications.  We do not need lab work at this moment

## 2023-09-08 ENCOUNTER — Telehealth: Payer: Self-pay

## 2023-09-08 NOTE — Telephone Encounter (Signed)
 Pharmacy Patient Advocate Encounter  Received notification from [facility] that Prior Authorization for LACTULOSE  PACKETS has been DENIED.  Full denial letter will be uploaded to the media tab. See denial reason below.  Why was coverage for this drug denied?  We denied coverage for this drug because:   The information provided by your prescriber did not meet the requirements for covering this medication (prior authorization). Your plan does not allow coverage of this medication based on your prescriber answering No to the following question(s):   Has the patient experienced an inadequate treatment response to a one month trial of generic lactulose  solution?   Has the patient experienced an intolerance that would prohibit a one month trial of generic lactulose  solution?   Does the patient have a contraindication to an inactive ingredient in generic lactulose  solution which is not contained in the requested drug?  PA #/Case ID/Reference #: [id number]

## 2023-09-08 NOTE — Telephone Encounter (Signed)
 Pharmacy Patient Advocate Encounter   Received notification from CoverMyMeds that prior authorization for Lactulose  10GM packets is required/requested.   Insurance verification completed.   The patient is insured through [facility] MEDIC[facility].   PA required; PA submitted to above mentioned insurance via CoverMyMeds Key/confirmation #/EOC [facility]. Status is pending

## 2023-09-12 MED ORDER — LACTULOSE 10 GM/15ML PO SOLN: 10.0000 g | Freq: Every day | ORAL | 3 refills | Status: DC

## 2023-09-24 ENCOUNTER — Other Ambulatory Visit: Payer: Self-pay | Admitting: Family Medicine

## 2023-11-22 ENCOUNTER — Ambulatory Visit: Admitting: Family Medicine

## 2023-11-22 VITALS — BP 134/87 | HR 94 | Ht 64.0 in | Wt 176.0 lb

## 2023-11-22 DIAGNOSIS — M5410 Radiculopathy, site unspecified: Secondary | ICD-10-CM

## 2023-11-23 NOTE — Assessment & Plan Note (Signed)
 Has completed 6 weeks of physical therapy now for neck, upper back and shoulders with no improvement in radicular pain.WIll need MRI to furether evaluate as I am concerned for foraminal stenosis at C5-C6

## 2023-11-23 NOTE — Progress Notes (Unsigned)
    CHIEF COMPLAINT / HPI: Right upper extremity pain.  Was seen in May for corticosteroid shot in her shoulder which helped a typical shoulder pain but did not seem to help this radicular pain in her right upper extremity.  Since then, the shoulder has gotten some better but the burning lancinating pain in the upper arm has continued to radiate down into her C5-C6 distribution of the hand.  She is also noticing some weakness in grip (opening jars) and in picking up heavier objects (child).    She has been diligent with a home exercise program doing the exercises at least once a day 5 or 6 days a week.   She has tried icing, heat, topical NSAIDs such as diclofenac, stretching, Tylenol .  Notices seem to make any improvement  in  either the weakness or the lancinating pain and in fact she seems to be getting worse, particularly the problems with the grip.   PERTINENT  PMH / PSH: I have reviewed the patient's medications, allergies, past medical and surgical history, smoking status and updated in the EMR as appropriate. C Spine X ray 06/2023 Report: COMPARISON:  Cervical spine CT 07/04/2013  FINDINGS: There is no evidence of cervical spine fracture or prevertebral soft tissue swelling. Alignment is normal. There is moderate disc space narrowing at C5-C6 which has slightly progressed compared to 2015.  IMPRESSION: Moderate disc space narrowing at C5-C6 which has slightly progressed compared to 2015.     OBJECTIVE:  BP 134/87   Pulse 94   Ht 5' 4 (1.626 m)   Wt 176 lb (79.8 kg)   SpO2 99%   BMI 30.21 kg/m  GENERAL: Well-developed no acute distress MSK: Shoulders: Full range of motion.  Intact strength in all planes of rotator cuff movement.   Deltoid R 4-5/5, L 5/5 Bicep strength slightly decreased on the right compared with the left 4-5/5 on the right, 5/5 on the left.   Triceps 4-5 R, 5 L.  Wrist extension 4/5 R. 5/5 L.  Wrist flexion bilaterally 5/5 NEURO: DTRS 2+ bilaterally equal  elbow and forearm.intact sensation to soft touch bilateral hands VASC radial pulses 2+ B= SKIN no unusual warmth or erythema or rash B UE  ASSESSMENT / PLAN:  I reviewed with her the prior workup and imaging, the conservative treatment to date. Discussion and chart review, discussion of options, possible further work up, possible solutions based on that work up. total time spent 30 minutes. Radiculopathy affecting upper extremity Has completed 6 weeks of physical therapy now for neck, upper back and shoulders with no improvement in radicular pain.WIll need MRI to furether evaluate as I am concerned for foraminal stenosis at C5-C6  Camie Mulch MD

## 2023-11-24 ENCOUNTER — Encounter: Payer: Self-pay | Admitting: Family Medicine

## 2023-12-08 ENCOUNTER — Other Ambulatory Visit: Payer: Self-pay | Admitting: Family Medicine

## 2023-12-09 ENCOUNTER — Other Ambulatory Visit: Payer: Self-pay | Admitting: Family Medicine

## 2023-12-23 ENCOUNTER — Other Ambulatory Visit: Payer: Self-pay | Admitting: Family Medicine

## 2023-12-23 DIAGNOSIS — M4722 Other spondylosis with radiculopathy, cervical region: Secondary | ICD-10-CM

## 2024-02-01 ENCOUNTER — Ambulatory Visit

## 2024-02-01 VITALS — Ht 64.0 in | Wt 175.0 lb

## 2024-02-01 DIAGNOSIS — Z0000 Encounter for general adult medical examination without abnormal findings: Secondary | ICD-10-CM

## 2024-02-01 NOTE — Progress Notes (Signed)
 Chief Complaint  Patient presents with   Medicare Wellness    INITIAL      Subjective:   Paula Aguilar is a 68 y.o. female who presents for a [employer] Visit.  Visit info / Clinical Intake: Medicare Wellness Visit Type:: Initial Annual Wellness Visit Persons participating in visit and providing information:: patient Medicare Wellness Visit Mode:: Telephone If telephone:: video declined Since this visit was completed virtually, some vitals may be partially provided or unavailable. Missing vitals are due to the limitations of the virtual format.: Documented vitals are patient reported If Telephone or Video please confirm:: I connected with patient using audio/video enable telemedicine. I verified patient identity with two identifiers, discussed telehealth limitations, and patient agreed to proceed. Patient Location:: HOME Provider Location:: HOME OFFICE Interpreter Needed?: No Pre-visit prep was completed: yes AWV questionnaire completed by patient prior to visit?: no Living arrangements:: lives with spouse/significant other Patient's Overall Health Status Rating: (!) fair Typical amount of pain: some (NECK & SHOULDER PAIN) Does pain affect daily life?: no Are you currently prescribed opioids?: no  Dietary Habits and Nutritional Risks How many meals a day?: 3 Eats fruit and vegetables daily?: yes Most meals are obtained by: preparing own meals In the last 2 weeks, have you had any of the following?: none Diabetic:: no  Functional Status Activities of Daily Living (to include ambulation/medication): Independent Ambulation: Independent Medication Administration: Independent Home Management (perform basic housework or laundry): Independent Manage your own finances?: yes Primary transportation is: driving Concerns about vision?: no *vision screening is required for WTM* Concerns about hearing?: no  Fall Screening Falls in the past year?: 0 Number of falls  in past year: 0 Was there an injury with Fall?: 0 Fall Risk Category Calculator: 0 Patient Fall Risk Level: Low Fall Risk  Fall Risk Patient at Risk for Falls Due to: No Fall Risks Fall risk Follow up: Falls evaluation completed; Education provided  Home and Transportation Safety: All rugs have non-skid backing?: N/A, no rugs All stairs or steps have railings?: yes Grab bars in the bathtub or shower?: (!) no Have non-skid surface in bathtub or shower?: yes Good home lighting?: yes Regular seat belt use?: yes Hospital stays in the last year:: no  Cognitive Assessment Difficulty concentrating, remembering, or making decisions? : no Will 6CIT or Mini Cog be Completed: yes What year is it?: 0 points What month is it?: 0 points Give patient an address phrase to remember (5 components): [place] [address] About what time is it?: 0 points Count backwards from 20 to 1: 0 points Say the months of the year in reverse: 0 points Repeat the address phrase from earlier: 0 points 6 CIT Score: 0 points  Advance Directives (For Healthcare) Does Patient Have a Medical Advance Directive?: Yes Does patient want to make changes to medical advance directive?: No - Patient declined Type of Advance Directive: Living will Copy of Living Will in Chart?: No - copy requested  Reviewed/Updated  Reviewed/Updated: Reviewed All (Medical, Surgical, Family, Medications, Allergies, Care Teams, Patient Goals)    Allergies (verified) Patient has no known allergies.   Current Medications (verified) Outpatient Encounter Medications as of 02/01/2024  Medication Sig   albuterol  (VENTOLIN  HFA) 108 (90 Base) MCG/ACT inhaler Inhale 2 puffs into the lungs 2 (two) times daily as needed for wheezing or shortness of breath.   budesonide -formoterol  (SYMBICORT ) 80-4.5 MCG/ACT inhaler One puff inhaled twice a day every day   citalopram  (CELEXA ) 40 MG  tablet Take 1 tablet (40 mg total) by mouth daily.    cyclobenzaprine  (FLEXERIL ) 10 MG tablet Take 1 tablet (10 mg total) by mouth at bedtime.   estrogen, conjugated,-medroxyprogesterone (PREMPRO ) 0.3-1.5 MG tablet Take 1 tablet by mouth daily.   furosemide  (LASIX ) 20 MG tablet TAKE ONE BY MOUTH DAILY AS DIRECTED FOR EDEMA   gabapentin  (NEURONTIN ) 100 MG capsule TAKE 2 BY MOUTH AT BEDTIME   gabapentin  (NEURONTIN ) 300 MG capsule Take 1 capsule (300 mg total) by mouth at bedtime.   lactulose  (CHRONULAC ) 10 GM/15ML solution Take 15 ml by mouth daily   linaclotide  (LINZESS ) 145 MCG CAPS capsule Take 1 capsule (145 mcg total) by mouth daily before breakfast.   meloxicam  (MOBIC ) 15 MG tablet TAKE 1 TABLET BY MOUTH EVERY DAY   montelukast  (SINGULAIR ) 10 MG tablet TAKE 1 TABLET BY MOUTH EVERY DAY   omeprazole  (PRILOSEC) 40 MG capsule TAKE 1 CAPSULE BY MOUTH TWICE A DAY   semaglutide -weight management (WEGOVY ) 0.25 MG/0.5ML SOAJ SQ injection INJECT 0.25 MG SUBCUTANEOUSLY ONE TIME PER WEEK   No facility-administered encounter medications on file as of 02/01/2024.    History: Past Medical History:  Diagnosis Date   Arthritis    Asthma    Basal cell carcinoma 08/15/2019   Left Chest Wall   BCC (basal cell carcinoma of skin) 2021   frontal scalp mohs   Blood transfusion without reported diagnosis 1976   post cesarean section   GERD (gastroesophageal reflux disease)    Hypertension    not requiring medication since 09/2010   Seasonal allergies    Sinusitis    Past Surgical History:  Procedure Laterality Date   AUGMENTATION MAMMAPLASTY     CESAREAN SECTION     CHOLECYSTECTOMY     COLONOSCOPY     KNEE ARTHROSCOPY     right side   Family History  Problem Relation Age of Onset   Heart failure Mother    COPD Mother    Diabetes Mother    Heart attack Father    Colon cancer Maternal Uncle    Esophageal cancer Neg Hx    Rectal cancer Neg Hx    Stomach cancer Neg Hx    Social History   Occupational History   Occupation: mail carrier   Tobacco Use   Smoking status: Never   Smokeless tobacco: Never  Vaping Use   Vaping status: Never Used  Substance and Sexual Activity   Alcohol use: No   Drug use: No   Sexual activity: Not on file   Tobacco Counseling Counseling given: Not Answered  SDOH Screenings   Food Insecurity: No Food Insecurity (02/01/2024)  Housing: Low Risk (02/01/2024)  Transportation Needs: No Transportation Needs (02/01/2024)  Utilities: Not At Risk (02/01/2024)  Alcohol Screen: Low Risk (02/01/2024)  Depression (PHQ2-9): Low Risk (02/01/2024)  Financial Resource Strain: Low Risk (02/01/2024)  Physical Activity: Sufficiently Active (02/01/2024)  Social Connections: Socially Integrated (02/01/2024)  Stress: No Stress Concern Present (02/01/2024)  Tobacco Use: Low Risk (02/01/2024)  Health Literacy: Adequate Health Literacy (02/01/2024)   See flowsheets for full screening details  Depression Screen PHQ 2 & 9 Depression Scale- Over the past 2 weeks, how often have you been bothered by any of the following problems? Little interest or pleasure in doing things: 0 Feeling down, depressed, or hopeless (PHQ Adolescent also includes...irritable): 0 PHQ-2 Total Score: 0 Trouble falling or staying asleep, or sleeping too much: 0 Feeling tired or having little energy: 0 Poor appetite or overeating (PHQ  Adolescent also includes...weight loss): 0 Feeling bad about yourself - or that you are a failure or have let yourself or your family down: 0 Trouble concentrating on things, such as reading the newspaper or watching television (PHQ Adolescent also includes...like school work): 0 Moving or speaking so slowly that other people could have noticed. Or the opposite - being so fidgety or restless that you have been moving around a lot more than usual: 0 Thoughts that you would be better off dead, or of hurting yourself in some way: 0 PHQ-9 Total Score: 0 If you checked off any problems, how difficult have  these problems made it for you to do your work, take care of things at home, or get along with other people?: Not difficult at all  Depression Treatment Depression Interventions/Treatment : EYV7-0 Score <4 Follow-up Not Indicated     Goals Addressed             This Visit's Progress    02/01/2024: My goal is to get down to 150 pounds.               Objective:    Today's Vitals   02/01/24 0915  Weight: 175 lb (79.4 kg)  Height: 5' 4 (1.626 m)  PainSc: 0-No pain   Body mass index is 30.04 kg/m.  Hearing/Vision screen Hearing Screening - Comments:: Adequate hearing. Vision Screening - Comments:: Adequate vision with the use of otc readers. Immunizations and Health Maintenance Health Maintenance  Topic Date Due   Hepatitis C Screening  Never done   Pneumococcal Vaccine: 50+ Years (1 of 2 - PCV) Never done   Zoster Vaccines- Shingrix (1 of 2) Never done   Mammogram  09/01/2019   COVID-19 Vaccine (3 - Pfizer risk series) 11/27/2019   Influenza Vaccine  09/22/2023   Medicare Annual Wellness (AWV)  01/31/2025   Colonoscopy  07/21/2027   DTaP/Tdap/Td (3 - Td or Tdap) 03/09/2032   Bone Density Scan  Completed   Meningococcal B Vaccine  Aged Out        Assessment/Plan:  This is a routine wellness examination for [facility].  Patient Care Team: Rosalynn Camie CROME, MD as PCP - General Nandigam, Kavitha V, MD as Consulting Physician (Gastroenterology)  I have personally reviewed and noted the following in the patients chart:   Medical and social history Use of alcohol, tobacco or illicit drugs  Current medications and supplements including opioid prescriptions. Functional ability and status Nutritional status Physical activity Advanced directives List of other physicians Hospitalizations, surgeries, and ER visits in previous 12 months Vitals Screenings to include cognitive, depression, and falls Referrals and appointments  No orders of the defined types were placed in  this encounter.  In addition, I have reviewed and discussed with patient certain preventive protocols, quality metrics, and best practice recommendations. A written personalized care plan for preventive services as well as general preventive health recommendations were provided to patient.   Roz LOISE Fuller, LPN   [id number]   Return in 1 year (on 01/31/2025).  After Visit Summary: (MyChart) Due to this being a telephonic visit, the after visit summary with patients personalized plan was offered to patient via MyChart   HM Addressed: Patient declined vaccines and mammogram.

## 2024-02-01 NOTE — Patient Instructions (Signed)
 Ms. Klosinski,  Thank you for taking the time for your Medicare Wellness Visit. I appreciate your continued commitment to your health goals. Please review the care plan we discussed, and feel free to reach out if I can assist you further.  Please note that Annual Wellness Visits do not include a physical exam. Some assessments may be limited, especially if the visit was conducted virtually. If needed, we may recommend an in-person follow-up with your provider.  Ongoing Care Seeing your primary care provider every 3 to 6 months helps us  monitor your health and provide consistent, personalized care.   Referrals If a referral was made during today's visit and you haven't received any updates within two weeks, please contact the referred provider directly to check on the status.  Recommended Screenings:  Health Maintenance  Topic Date Due   Medicare Annual Wellness Visit  Never done   Hepatitis C Screening  Never done   Pneumococcal Vaccine for age over 57 (1 of 2 - PCV) Never done   Zoster (Shingles) Vaccine (1 of 2) Never done   Breast Cancer Screening  09/01/2019   COVID-19 Vaccine (3 - Pfizer risk series) 11/27/2019   Flu Shot  09/22/2023   Colon Cancer Screening  07/21/2027   DTaP/Tdap/Td vaccine (3 - Td or Tdap) 03/09/2032   Osteoporosis screening with Bone Density Scan  Completed   Meningitis B Vaccine  Aged Out       02/01/2024    9:17 AM  Advanced Directives  Does Patient Have a Medical Advance Directive? Yes  Type of Advance Directive Living will  Does patient want to make changes to medical advance directive? No - Patient declined    Vision: Annual vision screenings are recommended for early detection of glaucoma, cataracts, and diabetic retinopathy. These exams can also reveal signs of chronic conditions such as diabetes and high blood pressure.  Dental: Annual dental screenings help detect early signs of oral cancer, gum disease, and other conditions linked to overall  health, including heart disease and diabetes.  Please see the attached documents for additional preventive care recommendations.

## 2024-03-14 ENCOUNTER — Other Ambulatory Visit: Payer: Self-pay | Admitting: Family Medicine

## 2024-03-17 ENCOUNTER — Other Ambulatory Visit: Payer: Self-pay | Admitting: Family Medicine
